# Patient Record
Sex: Female | Born: 1935 | Smoking: Former smoker
Health system: Southern US, Community
[De-identification: ages and names within clinical notes are randomized; demographics above are authoritative.]

## PROBLEM LIST (undated history)

## (undated) DIAGNOSIS — I341 Nonrheumatic mitral (valve) prolapse: Secondary | ICD-10-CM

## (undated) DIAGNOSIS — I10 Essential (primary) hypertension: Secondary | ICD-10-CM

## (undated) DIAGNOSIS — E785 Hyperlipidemia, unspecified: Secondary | ICD-10-CM

## (undated) DIAGNOSIS — J449 Chronic obstructive pulmonary disease, unspecified: Secondary | ICD-10-CM

## (undated) DIAGNOSIS — N183 Chronic kidney disease, stage 3 unspecified: Secondary | ICD-10-CM

## (undated) DIAGNOSIS — E039 Hypothyroidism, unspecified: Secondary | ICD-10-CM

## (undated) HISTORY — DX: Hyperlipidemia, unspecified: E78.5

## (undated) HISTORY — PX: BRAIN TUMOR EXCISION: SHX577

## (undated) HISTORY — PX: ABDOMINAL HYSTERECTOMY: SHX81

## (undated) HISTORY — DX: Chronic obstructive pulmonary disease, unspecified: J44.9

## (undated) HISTORY — PX: BACK SURGERY: SHX140

## (undated) HISTORY — PX: APPENDECTOMY: SHX54

## (undated) HISTORY — DX: Hypothyroidism, unspecified: E03.9

## (undated) HISTORY — DX: Nonrheumatic mitral (valve) prolapse: I34.1

## (undated) HISTORY — DX: Chronic kidney disease, stage 3 unspecified: N18.30

## (undated) HISTORY — PX: CHOLECYSTECTOMY: SHX55

## (undated) HISTORY — DX: Essential (primary) hypertension: I10

---

## 2018-05-05 ENCOUNTER — Encounter: Payer: Self-pay | Admitting: Cardiology

## 2019-04-13 ENCOUNTER — Encounter: Payer: Self-pay | Admitting: Family Medicine

## 2019-04-13 ENCOUNTER — Other Ambulatory Visit: Payer: Self-pay

## 2019-04-13 ENCOUNTER — Ambulatory Visit (INDEPENDENT_AMBULATORY_CARE_PROVIDER_SITE_OTHER): Payer: Medicare Other | Admitting: Family Medicine

## 2019-04-13 VITALS — BP 116/69 | HR 90

## 2019-04-13 DIAGNOSIS — J449 Chronic obstructive pulmonary disease, unspecified: Secondary | ICD-10-CM | POA: Diagnosis not present

## 2019-04-13 DIAGNOSIS — M810 Age-related osteoporosis without current pathological fracture: Secondary | ICD-10-CM

## 2019-04-13 DIAGNOSIS — I890 Lymphedema, not elsewhere classified: Secondary | ICD-10-CM

## 2019-04-13 DIAGNOSIS — K529 Noninfective gastroenteritis and colitis, unspecified: Secondary | ICD-10-CM

## 2019-04-13 DIAGNOSIS — M479 Spondylosis, unspecified: Secondary | ICD-10-CM

## 2019-04-13 DIAGNOSIS — M545 Low back pain, unspecified: Secondary | ICD-10-CM

## 2019-04-13 DIAGNOSIS — G8929 Other chronic pain: Secondary | ICD-10-CM

## 2019-04-13 DIAGNOSIS — M48061 Spinal stenosis, lumbar region without neurogenic claudication: Secondary | ICD-10-CM

## 2019-04-13 DIAGNOSIS — J309 Allergic rhinitis, unspecified: Secondary | ICD-10-CM | POA: Insufficient documentation

## 2019-04-13 DIAGNOSIS — E039 Hypothyroidism, unspecified: Secondary | ICD-10-CM | POA: Diagnosis not present

## 2019-04-13 DIAGNOSIS — I259 Chronic ischemic heart disease, unspecified: Secondary | ICD-10-CM | POA: Insufficient documentation

## 2019-04-13 DIAGNOSIS — G629 Polyneuropathy, unspecified: Secondary | ICD-10-CM

## 2019-04-13 DIAGNOSIS — G2581 Restless legs syndrome: Secondary | ICD-10-CM

## 2019-04-13 MED ORDER — PRAVASTATIN SODIUM 80 MG PO TABS: 80.0000 mg | ORAL_TABLET | Freq: Every day | ORAL | 3 refills | Status: DC

## 2019-04-13 MED ORDER — TEMAZEPAM 30 MG PO CAPS: 30.0000 mg | ORAL_CAPSULE | Freq: Every evening | ORAL | 0 refills | Status: DC | PRN

## 2019-04-13 NOTE — Progress Notes (Signed)
Office Visit Note   Patient: Kiara Jones           Date of Birth: November 05, 1935           MRN: [id number] Visit Date: 04/13/2019 Requested by: No referring provider defined for this encounter. PCP: No primary care provider on file.  Subjective: Chief Complaint  Patient presents with  . establish primary care    HPI: This is a very nice 83 year old woman who presents to establish care.  She was born in this area but moved away when she was 83 years old and lived in [place] for a while and then [place].  A few months ago her sister died and so she decided to move back here with her sister's husband.  She has a history of chronic back pain.  She has had 17 surgeries and multiple other interventional procedures which have not helped.  For many years now she has been on oxycodone 10 mg every 4 hours as needed and extended release OxyContin as well.  This regimen apparently has not changed.  She is still getting refills from her pain specialist while she gets settled, but she would like referral to pain specialist who is willing to manage her medication without doing more interventional procedures.  She has a history of ischemic heart disease which is stable.  Blood pressure is well controlled with losartan and amlodipine.  She sees a cardiologist yearly for EKG and stress testing.  She is asymptomatic today.  She has a history of COPD due to smoking.  She no longer smokes cigarettes.  Her lungs have been well controlled with Flovent.  She would like to establish with a pulmonologist for periodic monitoring.  She has hypothyroidism well controlled with medication.  She will be due for labs in June.  She has osteoporosis and a history of vitamin D deficiency.  She is presently on vitamin D3 at 2000 IU daily.  She has restless legs syndrome and uses Sinemet, and takes occasional Restoril at night to help her sleep.  She has a history of colitis which is currently stable.  She has bilateral  chronic lymphedema in her legs.  She has peripheral neuropathy in her hands and feet.                ROS: No fevers or chills.  No palpitations or chest pain.  No abdominal pain but she does have frequent constipation.  All other systems were reviewed and are negative.  Objective: Vital Signs: BP 116/69   Pulse 90   Physical Exam:  General:  Alert and oriented, in no acute distress. Pulm:  Breathing unlabored. Psy:  Normal mood, congruent affect. Skin: No visible rash. Neck: No thyromegaly or nodules.  2+ carotid pulses with no bruits. Lungs: Good air movement throughout with no wheezing today. Heart: Regular rate and rhythm with no murmurs, rubs, or gallops. Extremities: 2+ edema in both lower extremities to the knees. Low back: She has extensive well-healed surgical scars.  She has a couple spinous processes that protrude further than the others.  To the left of midline in the upper lumbar area there is a soft tissue firm movable nodule measuring a little more than a centimeter in length.   Imaging: None today  Assessment & Plan: 1.  Chronic back pain status post 17 surgeries and multiple interventional procedures. -We will refer her to pain management for medication management only.  2.  Chronic ischemic heart disease, currently asymptomatic. -We will refer  her to establish with cardiology.  3.  COPD, well controlled. -Pulmonary consult.  4.  Hypothyroidism, clinically euthyroid -Refills when needed.  Labs in 6 months.  5.  Colitis, currently stable. -We discussed potential dietary changes if symptoms flareup again.  6.  Osteoporosis -Vitamin D level in 6 months.  We will request old records and check a new bone density test when indicated.     Procedures: No procedures performed  No notes on file     PMFS History: Patient Active Problem List   Diagnosis Date Noted  . Allergic rhinitis 04/13/2019  . COPD (chronic obstructive pulmonary disease) ([place])  04/13/2019  . Hypothyroid 04/13/2019  . Osteoporosis 04/13/2019  . RLS (restless legs syndrome) 04/13/2019  . Chronic ischemic heart disease 04/13/2019  . Spondylosis 04/13/2019  . Colitis 04/13/2019  . Spinal stenosis of lumbar region 04/13/2019  . Peripheral neuropathy 04/13/2019  . Lymphedema of leg 04/13/2019   History reviewed. No pertinent past medical history.  History reviewed. No pertinent family history.  History reviewed. No pertinent surgical history. Social History   Occupational History  . Not on file  Tobacco Use  . Smoking status: Not on file  Substance and Sexual Activity  . Alcohol use: Not on file  . Drug use: Not on file  . Sexual activity: Not on file

## 2019-04-14 ENCOUNTER — Telehealth: Payer: Self-pay | Admitting: *Deleted

## 2019-04-14 NOTE — Telephone Encounter (Signed)
A message was left, re: her new patient appointment. 

## 2019-05-06 ENCOUNTER — Telehealth: Payer: Self-pay | Admitting: Family Medicine

## 2019-05-06 NOTE — Telephone Encounter (Signed)
Patient's brother called. Says they have not heard anything from the pain doctor. Would like to know if the referral was done. His call back number is [phone]

## 2019-05-07 NOTE — Telephone Encounter (Signed)
I advised Mr. Kiara Jones the turn-around time with pain management referrals/medical record reviews is typically 4-6 weeks. The pain management doctor's office will call the patient directly with the appointment date/time.

## 2019-05-11 NOTE — Progress Notes (Deleted)
Referring-Michael Hilts MD Reason for referral-coronary artery disease  HPI: 84 year old female for evaluation of coronary artery disease at request of Lavada Mesi MD.  Patient previously resided in [place] and [place].  Current Outpatient Medications  Medication Sig Dispense Refill  . amLODipine (NORVASC) 2.5 MG tablet Take 2.5 mg by mouth 2 (two) times daily.    . busPIRone (BUSPAR) 15 MG tablet Take 15 mg by mouth 2 (two) times daily.    . carbidopa-levodopa (SINEMET IR) 25-100 MG tablet Take 2 tablets by mouth daily.    Marland Kitchen FLOVENT DISKUS 250 MCG/BLIST AEPB Take 1 puff by mouth 2 (two) times daily.    . hydrochlorothiazide (MICROZIDE) 12.5 MG capsule Take 12.5 mg by mouth daily.    Marland Kitchen levothyroxine (SYNTHROID) 75 MCG tablet Take 75 mcg by mouth daily.    Marland Kitchen losartan (COZAAR) 100 MG tablet Take 100 mg by mouth daily.    Marland Kitchen morphine (MS CONTIN) 30 MG 12 hr tablet Take 30 mg by mouth 2 (two) times daily.    Marland Kitchen oxyCODONE-acetaminophen (PERCOCET) 10-325 MG tablet Take 1 tablet by mouth every 4 (four) hours as needed.    . pravastatin (PRAVACHOL) 80 MG tablet Take 1 tablet (80 mg total) by mouth daily. 90 tablet 3  . temazepam (RESTORIL) 30 MG capsule Take 1 capsule (30 mg total) by mouth at bedtime as needed for sleep. 90 capsule 0   No current facility-administered medications for this visit.    Allergies  Allergen Reactions  . Gabapentin Shortness Of Breath and Other (See Comments)    Fatigue, blurred vision  . Lyrica [Pregabalin] Shortness Of Breath and Other (See Comments)    Blurred vision, fatigue  . Alprazolam Other (See Comments)    Sick to stomach, tremors inside & out, loss of appetite  . Codeine Other (See Comments)    Heartburn, ulcer upset indigestion  . Nsaids Other (See Comments)    "upsets my ulcer"   . Penicillins Other (See Comments)    Cold sweat, almost passed out  . Sertraline Other (See Comments)    Sick to stomach, tremors inside & out, loss of appetite    . Tape   . Silicone Rash    Silicone-based pain patches - caused a rash  . Sulfa Antibiotics Rash    No past medical history on file.  No past surgical history on file.  Social History   Socioeconomic History  . Marital status: Widowed    Spouse name: Not on file  . Number of children: Not on file  . Years of education: Not on file  . Highest education level: Not on file  Occupational History  . Not on file  Tobacco Use  . Smoking status: Not on file  Substance and Sexual Activity  . Alcohol use: Not on file  . Drug use: Not on file  . Sexual activity: Not on file  Other Topics Concern  . Not on file  Social History Narrative  . Not on file   Social Determinants of Health   Financial Resource Strain:   . Difficulty of Paying Living Expenses: Not on file  Food Insecurity:   . Worried About Programme researcher, broadcasting/film/video in the Last Year: Not on file  . Ran Out of Food in the Last Year: Not on file  Transportation Needs:   . Lack of Transportation (Medical): Not on file  . Lack of Transportation (Non-Medical): Not on file  Physical Activity:   . Days of  Exercise per Week: Not on file  . Minutes of Exercise per Session: Not on file  Stress:   . Feeling of Stress : Not on file  Social Connections:   . Frequency of Communication with Friends and Family: Not on file  . Frequency of Social Gatherings with Friends and Family: Not on file  . Attends Religious Services: Not on file  . Active Member of Clubs or Organizations: Not on file  . Attends Archivist Meetings: Not on file  . Marital Status: Not on file  Intimate Partner Violence:   . Fear of Current or Ex-Partner: Not on file  . Emotionally Abused: Not on file  . Physically Abused: Not on file  . Sexually Abused: Not on file    No family history on file.  ROS: no fevers or chills, productive cough, hemoptysis, dysphasia, odynophagia, melena, hematochezia, dysuria, hematuria, rash, seizure activity,  orthopnea, PND, pedal edema, claudication. Remaining systems are negative.  Physical Exam:   There were no vitals taken for this visit.  General:  Well developed/well nourished in NAD Skin warm/dry Patient not depressed No peripheral clubbing Back-normal HEENT-normal/normal eyelids Neck supple/normal carotid upstroke bilaterally; no bruits; no JVD; no thyromegaly chest - CTA/ normal expansion CV - RRR/normal S1 and S2; no murmurs, rubs or gallops;  PMI nondisplaced Abdomen -NT/ND, no HSM, no mass, + bowel sounds, no bruit 2+ femoral pulses, no bruits Ext-no edema, chords, 2+ DP Neuro-grossly nonfocal  ECG - personally reviewed  A/P  1 coronary artery disease-  Kirk Ruths, MD

## 2019-05-13 ENCOUNTER — Ambulatory Visit: Payer: Medicare Other | Admitting: Cardiology

## 2019-06-02 NOTE — Progress Notes (Deleted)
Referring-Michael Hilts MD Reason for referral-coronary artery disease  HPI: 84 year old female for evaluation of coronary artery disease at request of Eunice Blase MD.  Current Outpatient Medications  Medication Sig Dispense Refill  . amLODipine (NORVASC) 2.5 MG tablet Take 2.5 mg by mouth 2 (two) times daily.    . busPIRone (BUSPAR) 15 MG tablet Take 15 mg by mouth 2 (two) times daily.    . carbidopa-levodopa (SINEMET IR) 25-100 MG tablet Take 2 tablets by mouth daily.    Marland Kitchen FLOVENT DISKUS 250 MCG/BLIST AEPB Take 1 puff by mouth 2 (two) times daily.    . hydrochlorothiazide (MICROZIDE) 12.5 MG capsule Take 12.5 mg by mouth daily.    Marland Kitchen levothyroxine (SYNTHROID) 75 MCG tablet Take 75 mcg by mouth daily.    Marland Kitchen losartan (COZAAR) 100 MG tablet Take 100 mg by mouth daily.    Marland Kitchen morphine (MS CONTIN) 30 MG 12 hr tablet Take 30 mg by mouth 2 (two) times daily.    Marland Kitchen oxyCODONE-acetaminophen (PERCOCET) 10-325 MG tablet Take 1 tablet by mouth every 4 (four) hours as needed.    . pravastatin (PRAVACHOL) 80 MG tablet Take 1 tablet (80 mg total) by mouth daily. 90 tablet 3  . temazepam (RESTORIL) 30 MG capsule Take 1 capsule (30 mg total) by mouth at bedtime as needed for sleep. 90 capsule 0   No current facility-administered medications for this visit.    Allergies  Allergen Reactions  . Gabapentin Shortness Of Breath and Other (See Comments)    Fatigue, blurred vision  . Lyrica [Pregabalin] Shortness Of Breath and Other (See Comments)    Blurred vision, fatigue  . Alprazolam Other (See Comments)    Sick to stomach, tremors inside & out, loss of appetite  . Codeine Other (See Comments)    Heartburn, ulcer upset indigestion  . Nsaids Other (See Comments)    "upsets my ulcer"   . Penicillins Other (See Comments)    Cold sweat, almost passed out  . Sertraline Other (See Comments)    Sick to stomach, tremors inside & out, loss of appetite  . Tape   . Silicone Rash    Silicone-based pain  patches - caused a rash  . Sulfa Antibiotics Rash    No past medical history on file.  No past surgical history on file.  Social History   Socioeconomic History  . Marital status: Widowed    Spouse name: Not on file  . Number of children: Not on file  . Years of education: Not on file  . Highest education level: Not on file  Occupational History  . Not on file  Tobacco Use  . Smoking status: Not on file  Substance and Sexual Activity  . Alcohol use: Not on file  . Drug use: Not on file  . Sexual activity: Not on file  Other Topics Concern  . Not on file  Social History Narrative  . Not on file   Social Determinants of Health   Financial Resource Strain:   . Difficulty of Paying Living Expenses: Not on file  Food Insecurity:   . Worried About Charity fundraiser in the Last Year: Not on file  . Ran Out of Food in the Last Year: Not on file  Transportation Needs:   . Lack of Transportation (Medical): Not on file  . Lack of Transportation (Non-Medical): Not on file  Physical Activity:   . Days of Exercise per Week: Not on file  . Minutes  of Exercise per Session: Not on file  Stress:   . Feeling of Stress : Not on file  Social Connections:   . Frequency of Communication with Friends and Family: Not on file  . Frequency of Social Gatherings with Friends and Family: Not on file  . Attends Religious Services: Not on file  . Active Member of Clubs or Organizations: Not on file  . Attends Banker Meetings: Not on file  . Marital Status: Not on file  Intimate Partner Violence:   . Fear of Current or Ex-Partner: Not on file  . Emotionally Abused: Not on file  . Physically Abused: Not on file  . Sexually Abused: Not on file    No family history on file.  ROS: no fevers or chills, productive cough, hemoptysis, dysphasia, odynophagia, melena, hematochezia, dysuria, hematuria, rash, seizure activity, orthopnea, PND, pedal edema, claudication. Remaining  systems are negative.  Physical Exam:   There were no vitals taken for this visit.  General:  Well developed/well nourished in NAD Skin warm/dry Patient not depressed No peripheral clubbing Back-normal HEENT-normal/normal eyelids Neck supple/normal carotid upstroke bilaterally; no bruits; no JVD; no thyromegaly chest - CTA/ normal expansion CV - RRR/normal S1 and S2; no murmurs, rubs or gallops;  PMI nondisplaced Abdomen -NT/ND, no HSM, no mass, + bowel sounds, no bruit 2+ femoral pulses, no bruits Ext-no edema, chords, 2+ DP Neuro-grossly nonfocal  ECG - personally reviewed  A/P  1 coronary artery disease-  2 hypertension-blood pressure controlled.  Continue present medical regimen.  3 hyperlipidemia-  Olga Millers, MD

## 2019-06-03 ENCOUNTER — Telehealth: Payer: Self-pay | Admitting: Orthopaedic Surgery

## 2019-06-03 NOTE — Telephone Encounter (Signed)
Mr. Roxan Hockey called on behalf of patient's referral for pain management. Mr. Roxan Hockey is asking for updates about referral being sent to pain management. Please give Mr. Roxan Hockey a call back. Mr. Roxan Hockey phone number is [phone].

## 2019-06-03 NOTE — Telephone Encounter (Signed)
Hilts patient.  

## 2019-06-04 NOTE — Telephone Encounter (Signed)
I called Mr. Kiara Jones back and gave him the number to [facility] [phone]) so he may call them directly to check on this (it has been 8 weeks since we referred her).

## 2019-06-11 ENCOUNTER — Ambulatory Visit: Payer: Medicare Other | Admitting: Cardiology

## 2019-06-17 ENCOUNTER — Telehealth: Payer: Self-pay | Admitting: Family Medicine

## 2019-06-17 NOTE — Telephone Encounter (Signed)
Referral was faxed on 04/13/19, has not heard anything from Tripp with [facility], called Ryan to check status lvm to return call

## 2019-06-17 NOTE — Telephone Encounter (Signed)
Can you check into this please? I don't know what else to tell him about this.

## 2019-06-17 NOTE — Telephone Encounter (Signed)
Patient's brother [name] called requesting a call back from Dr. Prince Rome or nurse. Mr. [name] stated it has been 3 weeks and still has not been contacted by pain management. No one has not reached ou about this matter. Mr. [name] stated Dr. Prince Rome refer patient weeks ago. Mr. [name] phone number is [phone].

## 2019-06-18 ENCOUNTER — Telehealth: Payer: Self-pay | Admitting: Family Medicine

## 2019-06-18 NOTE — Telephone Encounter (Signed)
Can we check on this one, it looks like the referral was put in, in December

## 2019-06-18 NOTE — Telephone Encounter (Signed)
Kiara Jones called in stating he still hasn't received a call from the pain management facility and would like to know if they can be referred else where? Kiara Jones also stated that he would really like a call back from Dr. Prince Rome to discuss this.   [phone]

## 2019-06-18 NOTE — Telephone Encounter (Signed)
Left message with [facility], they close early on Fridays.  Called and spoke with [name]. Explained that we are over the usually 4-6 week review process pain management usually requires. He advised he had also called [facility] with no return calls. I advised to him if I do not here anything on Monday on status and still cannot contact anyone. Will discuss with Dr. Prince Rome on referral else where. [name] was very appreciative of call back and plan.

## 2019-06-21 ENCOUNTER — Other Ambulatory Visit: Payer: Self-pay | Admitting: Family Medicine

## 2019-06-21 DIAGNOSIS — G8929 Other chronic pain: Secondary | ICD-10-CM

## 2019-06-21 DIAGNOSIS — M545 Low back pain, unspecified: Secondary | ICD-10-CM

## 2019-06-21 DIAGNOSIS — M479 Spondylosis, unspecified: Secondary | ICD-10-CM

## 2019-06-22 ENCOUNTER — Telehealth: Payer: Self-pay | Admitting: Radiology

## 2019-06-22 NOTE — Telephone Encounter (Signed)
noted 

## 2019-06-22 NOTE — Telephone Encounter (Signed)
I sw Todd and he is understanding about the issue with the fax with [facility] and that I did speak with Alycia Rossetti at pain mgmt and Alycia Rossetti will try to get her in no later than the end of March but will put her on the cancellatin list as well. I also informed [name] that I could send her to [facility] and he said pt would rather stick with [facility]. Ryan with Pain management is needing notes and cover sheet sent to him so he can get her scheduled.

## 2019-06-22 NOTE — Telephone Encounter (Signed)
Left message with [name] advising that our Kiara Jones had already been in contact with [facility] prior to speaking with me. Advised that the facility had issues with their fax for several weeks that was unfortunately delaying referral. Advised in message that Kiara Jones is expecting to speak with someone from [facility] today and that Dr. Prince Rome has already put in new referral if it is going to take too long to get patient scheduled.

## 2019-07-01 ENCOUNTER — Telehealth: Payer: Self-pay | Admitting: Family Medicine

## 2019-07-01 NOTE — Telephone Encounter (Signed)
Pt brother in law [name] called in requesting a call from terri in regards to the referral dr.hilts put in to the pain clinic said he has tried contacting them plenty of times but no success.   [phone]

## 2019-07-01 NOTE — Telephone Encounter (Signed)
I tried calling twice - both times the phone stopped ringing as if someone picks up, and then I get cut off. Will try again another time.

## 2019-07-01 NOTE — Telephone Encounter (Signed)
I spoke with [name]: Dr. Vear Clock' office has not called them back yet. He would like to proceed with a referral to [facility], instead. I have advised [name], our referral coordinator, of this. Her pain management doctor from [facility]  is still treating her until she can get in with someone here in [place].

## 2019-07-08 ENCOUNTER — Telehealth: Payer: Self-pay | Admitting: *Deleted

## 2019-07-08 NOTE — Telephone Encounter (Signed)
I have contacted Marchelle Folks with [facility] to return my call for status of appt.

## 2019-07-08 NOTE — Telephone Encounter (Signed)
-----   Message from Rip Harbour, [place] sent at 07/01/2019  1:26 PM EST ----- Regarding: pain management referral Per the patient's brother-in-law Jerlyn Ly, please refer the patient to [facility] for pain management. They are tired of waiting for a phone call from Dr. Vear Clock' office.

## 2019-07-12 ENCOUNTER — Telehealth: Payer: Self-pay | Admitting: Family Medicine

## 2019-07-12 NOTE — Telephone Encounter (Signed)
It looks like you have been dealing with this today. Have you spoken with [name]?

## 2019-07-12 NOTE — Telephone Encounter (Signed)
Kiara Jones pt brother had contacted me again stating still has not heard anyting from pain mgmt, I contact Marchelle Folks and left vm for her to return my call on status of referral. I called again today.

## 2019-07-12 NOTE — Telephone Encounter (Signed)
Patient's brother in law [name] called concerning the pain clinic patient was referred to. [name] said he has not heard anything in 2 months. The number to contact [name] is [phone]

## 2019-07-15 ENCOUNTER — Telehealth: Payer: Self-pay | Admitting: Family Medicine

## 2019-07-15 NOTE — Telephone Encounter (Signed)
Patient's brother called.   He is requesting a call back from Dr.Hilts' medical assistant and did not disclose why.   Call back: [phone]

## 2019-07-16 NOTE — Telephone Encounter (Signed)
Sw Todd today to see if he has heard from [place] and she stated he has not but he has called Marchelle Folks with [facility] and left message to return his call  I called [facility] and left message with Marchelle Folks vm asking for a call back to see about status of referral.

## 2019-07-16 NOTE — Telephone Encounter (Signed)
Patient aware that we actually sent her referral to [facility]. I gave him the number and he will check with them

## 2019-07-20 NOTE — Telephone Encounter (Signed)
Kiara Jones with [facility] called me back left vm to return her call at [phone] ext [phone], I called got a receptionist asked to sendn me to this ext and she stated she can take message and have her to call me back. Pending call back.

## 2019-07-20 NOTE — Telephone Encounter (Signed)
I called [name] and asked if he had heard from Kennedy with pain clinic and she stated no he hasn't, so I called over to [facility] pain left vm again with Marchelle Folks to return my call, then, I called the main number and sw with receptionist there to see if can speak with Marchelle Folks or asked if some reason she isn't returning calls, she contacted all facilities to see if can get her and no success, she is going to try to contact Marchelle Folks again for me and then have her to call me.

## 2019-07-21 NOTE — Telephone Encounter (Signed)
I called and left vm with Marchelle Folks to return my call again.

## 2019-07-22 NOTE — Telephone Encounter (Signed)
Per [name] and [place] with pain clinic pt is scheduled on 07/27/19 at 345pm.

## 2019-07-29 ENCOUNTER — Telehealth: Payer: Self-pay | Admitting: Family Medicine

## 2019-07-29 NOTE — Telephone Encounter (Signed)
Please advise 

## 2019-07-29 NOTE — Telephone Encounter (Signed)
Patient called needing Rx refilled Temazepam 30 mg, Pravastatin 80 mg and Amlodipine 2.5 mg. The number to contact patient is [phone]

## 2019-07-30 MED ORDER — AMLODIPINE BESYLATE 2.5 MG PO TABS: 2.5000 mg | ORAL_TABLET | Freq: Two times a day (BID) | ORAL | 2 refills | Status: DC

## 2019-07-30 MED ORDER — PRAVASTATIN SODIUM 80 MG PO TABS: 80.0000 mg | ORAL_TABLET | Freq: Every day | ORAL | 3 refills | Status: DC

## 2019-07-30 NOTE — Telephone Encounter (Signed)
See notes below. Can you submit all but temazepam

## 2019-07-30 NOTE — Telephone Encounter (Signed)
Tried calling to advise done. No answer. LMVM advising.  

## 2019-07-30 NOTE — Telephone Encounter (Signed)
Ok to fill all but temazepam.

## 2019-08-04 ENCOUNTER — Other Ambulatory Visit: Payer: Self-pay | Admitting: Family Medicine

## 2019-08-04 ENCOUNTER — Telehealth: Payer: Self-pay | Admitting: Family Medicine

## 2019-08-04 NOTE — Telephone Encounter (Signed)
Patient brother in law [name]) called asking why the Rx for Temazepam is not being sent to the pharmacy? [name] said there has been several calls concerning trying to get this Rx filled. Patient was also talking in the background advised she have not received any messages concerning the Rx. (Patient has two tabs left.)   [facility] uses the [facility] [address].   The number to contact [name] is [phone]

## 2019-08-04 NOTE — Telephone Encounter (Signed)
Please advise on the specific reason this one is not being refilled.

## 2019-08-04 NOTE — Telephone Encounter (Signed)
I called and spoke with [name] about the alternative. He asked that I call back and leave all the names and instructions on his voice mail. I did this and asked him to call back if he has any questions.

## 2019-08-04 NOTE — Progress Notes (Signed)
Referring-Michael Hilts MD Reason for referral-coronary artery disease and hypertension  HPI: 84 year old female for evaluation of coronary artery disease and hypertension at request of Lavada Mesi MD. Patient previously resided in [place].  I do not have all records available.  She has had previous catheterization but has never received stents.  She also has a history of mitral valve prolapse.  She recently moved to this area and presents to establish.  She has some dyspnea on exertion that she attributes to COPD.  She denies orthopnea or PND.  She has chronic bilateral lower extremity edema.  There is no chest pain or syncope.  Current Outpatient Medications  Medication Sig Dispense Refill  . amLODipine (NORVASC) 2.5 MG tablet Take 1 tablet (2.5 mg total) by mouth 2 (two) times daily. 180 tablet 2  . busPIRone (BUSPAR) 15 MG tablet Take 15 mg by mouth 2 (two) times daily.    . carbidopa-levodopa (SINEMET IR) 25-100 MG tablet Take 2 tablets by mouth daily.    Marland Kitchen FLOVENT DISKUS 250 MCG/BLIST AEPB Take 1 puff by mouth 2 (two) times daily.    . hydrochlorothiazide (MICROZIDE) 12.5 MG capsule Take 12.5 mg by mouth daily.    Marland Kitchen levothyroxine (SYNTHROID) 75 MCG tablet Take 75 mcg by mouth daily.    Marland Kitchen losartan (COZAAR) 100 MG tablet Take 100 mg by mouth daily.    . megestrol (MEGACE) 40 MG/ML suspension Take 800 mg by mouth daily.    Marland Kitchen morphine (MS CONTIN) 30 MG 12 hr tablet Take 30 mg by mouth 2 (two) times daily.    Marland Kitchen oxyCODONE-acetaminophen (PERCOCET) 10-325 MG tablet Take 1 tablet by mouth every 4 (four) hours as needed.    . pravastatin (PRAVACHOL) 80 MG tablet Take 1 tablet (80 mg total) by mouth daily. 90 tablet 3  . temazepam (RESTORIL) 30 MG capsule Take 1 capsule (30 mg total) by mouth at bedtime as needed for sleep. 90 capsule 0   No current facility-administered medications for this visit.    Allergies  Allergen Reactions  . Gabapentin Shortness Of Breath and Other (See Comments)     Fatigue, blurred vision  . Lyrica [Pregabalin] Shortness Of Breath and Other (See Comments)    Blurred vision, fatigue  . Alprazolam Other (See Comments)    Sick to stomach, tremors inside & out, loss of appetite  . Codeine Other (See Comments)    Heartburn, ulcer upset indigestion  . Nsaids Other (See Comments)    "upsets my ulcer"   . Penicillins Other (See Comments)    Cold sweat, almost passed out  . Sertraline Other (See Comments)    Sick to stomach, tremors inside & out, loss of appetite  . Tape   . Silicone Rash    Silicone-based pain patches - caused a rash  . Sulfa Antibiotics Rash     Past Medical History:  Diagnosis Date  . Chronic kidney disease (CKD), active medical management without dialysis, stage 3 (moderate)   . COPD (chronic obstructive pulmonary disease) (HCC)   . Hyperlipidemia   . Hypertension   . Hypothyroid     Past Surgical History:  Procedure Laterality Date  . ABDOMINAL HYSTERECTOMY    . APPENDECTOMY    . BACK SURGERY    . BRAIN TUMOR EXCISION    . CHOLECYSTECTOMY      Social History   Socioeconomic History  . Marital status: Widowed    Spouse name: Not on file  . Number of children: Not  on file  . Years of education: Not on file  . Highest education level: Not on file  Occupational History  . Not on file  Tobacco Use  . Smoking status: Former Smoker    Types: Cigarettes, E-cigarettes  . Smokeless tobacco: Former Network engineer and Sexual Activity  . Alcohol use: Never  . Drug use: Never  . Sexual activity: Not on file  Other Topics Concern  . Not on file  Social History Narrative  . Not on file   Social Determinants of Health   Financial Resource Strain:   . Difficulty of Paying Living Expenses:   Food Insecurity:   . Worried About Charity fundraiser in the Last Year:   . Arboriculturist in the Last Year:   Transportation Needs:   . Film/video editor (Medical):   Marland Kitchen Lack of Transportation (Non-Medical):     Physical Activity:   . Days of Exercise per Week:   . Minutes of Exercise per Session:   Stress:   . Feeling of Stress :   Social Connections:   . Frequency of Communication with Friends and Family:   . Frequency of Social Gatherings with Friends and Family:   . Attends Religious Services:   . Active Member of Clubs or Organizations:   . Attends Archivist Meetings:   Marland Kitchen Marital Status:   Intimate Partner Violence:   . Fear of Current or Ex-Partner:   . Emotionally Abused:   Marland Kitchen Physically Abused:   . Sexually Abused:     Family History  Problem Relation Age of Onset  . Breast cancer Mother   . CAD Father     ROS: Chronic back pain but no fevers or chills, productive cough, hemoptysis, dysphasia, odynophagia, melena, hematochezia, dysuria, hematuria, rash, seizure activity, orthopnea, PND, claudication. Remaining systems are negative.  Physical Exam:   Blood pressure (!) 160/80, pulse 95, temperature 98 F (36.7 C), resp. rate 20, height 4\' 10"  (1.473 m), weight 110 lb 12.8 oz (50.3 kg), SpO2 96 %.  General:  Well developed/well nourished in NAD Skin warm/dry Patient not depressed No peripheral clubbing Back-normal HEENT-normal/normal eyelids Neck supple/normal carotid upstroke bilaterally; no bruits; no JVD; no thyromegaly chest -diminished breath sounds throughout CV - RRR/normal S1 and S2; no murmurs, rubs or gallops;  PMI nondisplaced Abdomen -NT/ND, no HSM, no mass, + bowel sounds, no bruit 2+ femoral pulses, no bruits Ext-trac edema, chords, 2+ DP Neuro-grossly nonfocal  ECG -sinus rhythm at a rate of 89, cannot rule out prior septal infarct, left atrial enlargement.  Personally reviewed  A/P  1 coronary artery disease-we will obtain all records from [place].  She has never received a stent and is unsure if she has documented coronary disease.  We will continue statin for now.  We will add aspirin later if needed.  2 hypertension-blood pressure  elevated but she states typically controlled.  Continue present medications and follow.  3 hyperlipidemia-continue statin.  4 MVP-we will obtain outside records.  Kirk Ruths, MD

## 2019-08-04 NOTE — Telephone Encounter (Signed)
I spoke with Mr. Kiara Jones and explained the Temazepam is dangerous to take with Oxycodone - the pain clinic would need to prescribe it.   He asks if there is some alternative to the Temazepam that she could try instead.   Please advise.

## 2019-08-09 ENCOUNTER — Other Ambulatory Visit: Payer: Self-pay

## 2019-08-09 ENCOUNTER — Ambulatory Visit (INDEPENDENT_AMBULATORY_CARE_PROVIDER_SITE_OTHER): Payer: Medicare Other | Admitting: Cardiology

## 2019-08-09 ENCOUNTER — Encounter: Payer: Self-pay | Admitting: Cardiology

## 2019-08-09 VITALS — BP 160/80 | HR 95 | Temp 98.0°F | Resp 20 | Ht <= 58 in | Wt 110.8 lb

## 2019-08-09 DIAGNOSIS — I10 Essential (primary) hypertension: Secondary | ICD-10-CM | POA: Diagnosis not present

## 2019-08-09 DIAGNOSIS — I2510 Atherosclerotic heart disease of native coronary artery without angina pectoris: Secondary | ICD-10-CM

## 2019-08-09 DIAGNOSIS — E7800 Pure hypercholesterolemia, unspecified: Secondary | ICD-10-CM

## 2019-08-09 NOTE — Patient Instructions (Signed)

## 2019-08-13 ENCOUNTER — Telehealth: Payer: Self-pay | Admitting: Family Medicine

## 2019-08-13 NOTE — Telephone Encounter (Signed)
I called and spoke with the patient.  Dr. Prince Rome said she would need to contact her pain management doctor about getting an Rx for temazepam. She did see the pain management doctor (at [facility]) on 07/27/19, but she did not ask about prescribing temazepam - her PCP in [facility] was the one that prescribed it before. The patient will reach out to this doctor (at [place]) soon.

## 2019-08-13 NOTE — Telephone Encounter (Signed)
Patient called.   The vitamin regime she was switched to in place of her sleeping medicine is not doing the job for her. She would like a call back to discuss her next course of action.   Call back: [phone]

## 2019-08-23 ENCOUNTER — Other Ambulatory Visit: Payer: Self-pay | Admitting: Family Medicine

## 2019-09-15 ENCOUNTER — Other Ambulatory Visit: Payer: Self-pay

## 2019-09-15 ENCOUNTER — Encounter: Payer: Self-pay | Admitting: Family Medicine

## 2019-09-15 ENCOUNTER — Ambulatory Visit (INDEPENDENT_AMBULATORY_CARE_PROVIDER_SITE_OTHER): Payer: Medicare Other | Admitting: Family Medicine

## 2019-09-15 DIAGNOSIS — R300 Dysuria: Secondary | ICD-10-CM | POA: Diagnosis not present

## 2019-09-15 MED ORDER — NITROFURANTOIN MONOHYD MACRO 100 MG PO CAPS
100.0000 mg | ORAL_CAPSULE | Freq: Two times a day (BID) | ORAL | 0 refills | Status: DC
Start: 2019-09-15 — End: 2020-04-12

## 2019-09-15 NOTE — Progress Notes (Signed)
   Office Visit Note   Patient: Kiara Jones           Date of Birth: 1935-10-13           MRN: [id number] Visit Date: 09/15/2019 Requested by: Lavada Mesi, MD [address] [place],  [place] [address] PCP: Lavada Mesi, MD  Subjective: Chief Complaint  Patient presents with  . Lower Back - Pain    Pain around the sacral area x 2 weeks.  . dark, smelly urine w/urgency x many weeks    HPI: She is here with urinary frequency, hesitancy and urgency as well as foul urinary odor for the past 2 weeks.  She has not had a bladder infection for many years.  She is not sure what caused this.  No fevers or chills, no flank pain.  She has tried drinking cranberry juice and extra water with no improvement.               ROS:   All other systems were reviewed and are negative.  Objective: Vital Signs: There were no vitals taken for this visit.  Physical Exam:  General:  Alert and oriented, in no acute distress. Pulm:  Breathing unlabored. Psy:  Normal mood, congruent affect.  Back: No CVA tenderness. Abdomen: No suprapubic tenderness.  Imaging: No results found.  Assessment & Plan: 1.  Dysuria, suspect cystitis -Urine culture, presumptively treat with Macrobid.     Procedures: No procedures performed  No notes on file     PMFS History: Patient Active Problem List   Diagnosis Date Noted  . Allergic rhinitis 04/13/2019  . COPD (chronic obstructive pulmonary disease) (HCC) 04/13/2019  . Hypothyroid 04/13/2019  . Osteoporosis 04/13/2019  . RLS (restless legs syndrome) 04/13/2019  . Chronic ischemic heart disease 04/13/2019  . Spondylosis 04/13/2019  . Colitis 04/13/2019  . Spinal stenosis of lumbar region 04/13/2019  . Peripheral neuropathy 04/13/2019  . Lymphedema of leg 04/13/2019   Past Medical History:  Diagnosis Date  . Chronic kidney disease (CKD), active medical management without dialysis, stage 3 (moderate)   . COPD (chronic obstructive pulmonary  disease) (HCC)   . Hyperlipidemia   . Hypertension   . Hypothyroid   . Mitral valve prolapse     Family History  Problem Relation Age of Onset  . Breast cancer Mother   . CAD Father     Past Surgical History:  Procedure Laterality Date  . ABDOMINAL HYSTERECTOMY    . APPENDECTOMY    . BACK SURGERY    . BRAIN TUMOR EXCISION    . CHOLECYSTECTOMY     Social History   Occupational History  . Not on file  Tobacco Use  . Smoking status: Former Smoker    Types: Cigarettes, E-cigarettes  . Smokeless tobacco: Former Engineer, water and Sexual Activity  . Alcohol use: Never  . Drug use: Never  . Sexual activity: Not on file

## 2019-09-18 LAB — URINALYSIS W MICROSCOPIC + REFLEX CULTURE
Bilirubin Urine: NEGATIVE
Glucose, UA: NEGATIVE
Hgb urine dipstick: NEGATIVE
Hyaline Cast: NONE SEEN /LPF
Ketones, ur: NEGATIVE
Nitrites, Initial: POSITIVE — AB
Protein, ur: NEGATIVE
RBC / HPF: NONE SEEN /HPF (ref 0–2)
Specific Gravity, Urine: 1.018 (ref 1.001–1.03)
Squamous Epithelial / HPF: NONE SEEN /HPF (ref <=5)
pH: 6 (ref 5.0–8.0)

## 2019-09-18 LAB — URINE CULTURE
MICRO NUMBER:: 10524330
SPECIMEN QUALITY:: ADEQUATE

## 2019-09-18 LAB — CULTURE INDICATED

## 2019-09-20 ENCOUNTER — Encounter: Payer: Self-pay | Admitting: Family Medicine

## 2019-10-12 ENCOUNTER — Other Ambulatory Visit: Payer: Self-pay

## 2019-10-12 ENCOUNTER — Ambulatory Visit (INDEPENDENT_AMBULATORY_CARE_PROVIDER_SITE_OTHER): Payer: Medicare Other | Admitting: Family Medicine

## 2019-10-12 ENCOUNTER — Ambulatory Visit: Payer: Medicare Other | Admitting: Family Medicine

## 2019-10-12 ENCOUNTER — Encounter: Payer: Self-pay | Admitting: Family Medicine

## 2019-10-12 VITALS — BP 118/65 | HR 94

## 2019-10-12 DIAGNOSIS — E039 Hypothyroidism, unspecified: Secondary | ICD-10-CM | POA: Diagnosis not present

## 2019-10-12 DIAGNOSIS — K529 Noninfective gastroenteritis and colitis, unspecified: Secondary | ICD-10-CM

## 2019-10-12 DIAGNOSIS — E785 Hyperlipidemia, unspecified: Secondary | ICD-10-CM

## 2019-10-12 DIAGNOSIS — M810 Age-related osteoporosis without current pathological fracture: Secondary | ICD-10-CM

## 2019-10-12 DIAGNOSIS — J449 Chronic obstructive pulmonary disease, unspecified: Secondary | ICD-10-CM

## 2019-10-12 DIAGNOSIS — I259 Chronic ischemic heart disease, unspecified: Secondary | ICD-10-CM

## 2019-10-12 NOTE — Progress Notes (Signed)
Office Visit Note   Patient: Kiara Jones           Date of Birth: Sep 11, 1935           MRN: [id number] Visit Date: 10/12/2019 Requested by: Eunice Blase, MD [address] [place],  [place] [address] PCP: Eunice Blase, MD  Subjective: Chief Complaint  Patient presents with   6 months follow up, thyroid/vitamin D work up   memory loss over the past month (short-term)    HPI: She is here for routine monitoring of medical conditions.  She is taking Synthroid 75 mcg daily for hypothyroidism.  Feels good, no complaints other than recent short-term memory loss.  She states that she has had short-term memory troubles for a while, but in the past month it seems to have worsened.  She is not sure why.  Her recent urinary tract infection resolved with antibiotics.  She is not having any troubles with colitis related to that.  She tolerates Pravachol for hyperlipidemia.  From a blood pressure standpoint her readings are typically normal, but she has had a couple episodes of low blood pressure recently.  She is taking losartan 100 mg daily and amlodipine 2.5 mg twice daily.  COPD and heart disease remaine stable.               ROS:   All other systems were reviewed and are negative.  Objective: Vital Signs: BP 118/65    Pulse 94   Physical Exam:  General:  Alert and oriented, in no acute distress. Pulm:  Breathing unlabored. Psy:  Normal mood, congruent affect.  Neck: No thyromegaly, no carotid bruits. CV: Regular rate and rhythm without murmurs, rubs, or gallops.  No peripheral edema.  2+ radial and posterior tibial pulses. Lungs: Clear to auscultation throughout with no wheezing or areas of consolidation. Extremities: 2+ upper and lower DTRs.   Imaging: No results found.  Assessment & Plan: 1.  Hypothyroidism -Labs to evaluate.  2.  Short-term memory loss, etiology uncertain. -Check thyroid function, screen for anemia, make sure total cholesterol has not gotten  too low.  3.  Osteoporosis -We will check vitamin D levels.  4.  Hypertension -Blood pressure normal today. -Return in 6 months for routine visit.     Procedures: No procedures performed  No notes on file     PMFS History: Patient Active Problem List   Diagnosis Date Noted   Hyperlipidemia 10/12/2019   Allergic rhinitis 04/13/2019   Chronic obstructive pulmonary disease ([place]) 04/13/2019   Hypothyroidism 04/13/2019   Osteoporosis 04/13/2019   RLS (restless legs syndrome) 04/13/2019   Chronic ischemic heart disease 04/13/2019   Spondylosis 04/13/2019   Colitis 04/13/2019   Spinal stenosis of lumbar region 04/13/2019   Peripheral neuropathy 04/13/2019   Lymphedema of leg 04/13/2019   Past Medical History:  Diagnosis Date   Chronic kidney disease (CKD), active medical management without dialysis, stage 3 (moderate)    COPD (chronic obstructive pulmonary disease) (HCC)    Hyperlipidemia    Hypertension    Hypothyroid    Mitral valve prolapse     Family History  Problem Relation Age of Onset   Breast cancer Mother    CAD Father     Past Surgical History:  Procedure Laterality Date   ABDOMINAL HYSTERECTOMY     APPENDECTOMY     BACK SURGERY     BRAIN TUMOR EXCISION     CHOLECYSTECTOMY     Social History   Occupational History  Not on file  Tobacco Use   Smoking status: Former Smoker    Types: Cigarettes, E-cigarettes   Smokeless tobacco: Former Engineer, water and Sexual Activity   Alcohol use: Never   Drug use: Never   Sexual activity: Not on file

## 2019-10-13 ENCOUNTER — Telehealth: Payer: Self-pay | Admitting: Family Medicine

## 2019-10-13 LAB — CBC WITH DIFFERENTIAL/PLATELET
Absolute Monocytes: 533 cells/uL (ref 200–950)
Basophils Absolute: 31 cells/uL (ref 0–200)
Basophils Relative: 0.5 %
Eosinophils Absolute: 143 cells/uL (ref 15–500)
Eosinophils Relative: 2.3 %
HCT: 39 % (ref 35.0–45.0)
Hemoglobin: 13.3 g/dL (ref 11.7–15.5)
Lymphs Abs: 1314 cells/uL (ref 850–3900)
MCH: 31.4 pg (ref 27.0–33.0)
MCHC: 34.1 g/dL (ref 32.0–36.0)
MCV: 92 fL (ref 80.0–100.0)
MPV: 9.3 fL (ref 7.5–12.5)
Monocytes Relative: 8.6 %
Neutro Abs: 4179 cells/uL (ref 1500–7800)
Neutrophils Relative %: 67.4 %
Platelets: 287 10*3/uL (ref 140–400)
RBC: 4.24 10*6/uL (ref 3.80–5.10)
RDW: 13.8 % (ref 11.0–15.0)
Total Lymphocyte: 21.2 %
WBC: 6.2 10*3/uL (ref 3.8–10.8)

## 2019-10-13 LAB — COMPREHENSIVE METABOLIC PANEL
AG Ratio: 1.5 (calc) (ref 1.0–2.5)
ALT: 17 U/L (ref 6–29)
AST: 18 U/L (ref 10–35)
Albumin: 4.5 g/dL (ref 3.6–5.1)
Alkaline phosphatase (APISO): 52 U/L (ref 37–153)
BUN/Creatinine Ratio: 21 (calc) (ref 6–22)
BUN: 28 mg/dL — ABNORMAL HIGH (ref 7–25)
CO2: 30 mmol/L (ref 20–32)
Calcium: 10.7 mg/dL — ABNORMAL HIGH (ref 8.6–10.4)
Chloride: 99 mmol/L (ref 98–110)
Creat: 1.33 mg/dL — ABNORMAL HIGH (ref 0.60–0.88)
Globulin: 3.1 g/dL (calc) (ref 1.9–3.7)
Glucose, Bld: 133 mg/dL — ABNORMAL HIGH (ref 65–99)
Potassium: 3.4 mmol/L — ABNORMAL LOW (ref 3.5–5.3)
Sodium: 142 mmol/L (ref 135–146)
Total Bilirubin: 0.9 mg/dL (ref 0.2–1.2)
Total Protein: 7.6 g/dL (ref 6.1–8.1)

## 2019-10-13 LAB — THYROID PANEL WITH TSH
Free Thyroxine Index: 2.8 (ref 1.4–3.8)
T3 Uptake: 29 % (ref 22–35)
T4, Total: 9.6 ug/dL (ref 5.1–11.9)
TSH: 0.41 mIU/L (ref 0.40–4.50)

## 2019-10-13 LAB — LIPID PANEL
Cholesterol: 158 mg/dL (ref <200)
HDL: 70 mg/dL (ref >=50)
LDL Cholesterol (Calc): 69 mg/dL (calc)
Non-HDL Cholesterol (Calc): 88 mg/dL (calc) (ref <130)
Total CHOL/HDL Ratio: 2.3 (calc) (ref <5.0)
Triglycerides: 100 mg/dL (ref <150)

## 2019-10-13 LAB — VITAMIN D 25 HYDROXY (VIT D DEFICIENCY, FRACTURES): Vit D, 25-Hydroxy: 60 ng/mL (ref 30–100)

## 2019-10-13 NOTE — Telephone Encounter (Signed)
Labs show:  Thyroid looks perfect.  Vitamin D looks perfect.  Lipids look perfect.  Creatinine is slightly higher than last time (previously 1.24) at 1.33.  Will recheck in 3-4 months to be sure it's stable.

## 2019-10-19 ENCOUNTER — Telehealth: Payer: Self-pay | Admitting: Family Medicine

## 2019-10-19 NOTE — Telephone Encounter (Signed)
Opened in error

## 2019-12-06 ENCOUNTER — Telehealth: Payer: Self-pay | Admitting: Family Medicine

## 2019-12-06 MED ORDER — AZITHROMYCIN 250 MG PO TABS
ORAL_TABLET | ORAL | 0 refills | Status: DC
Start: 2019-12-06 — End: 2020-02-23

## 2019-12-06 NOTE — Telephone Encounter (Signed)
I called the patient to make sure she was able to pull up the MyChart message. Her brother-in-law was able to get it and read it to her. She understands she is to try all of the viral remedies first and take the antibiotic only if she worsens/fails to improve.

## 2019-12-06 NOTE — Telephone Encounter (Signed)
I called the patient to get more information: she has had a cough x 4 days, no longer productive - clear phlegm x 2 days and some whitish nasal discharge. No fever that she knows of, but she does feel hot then cold. She has been taking Robitussin DM - not helping. The patient said she gets these "cold" once a year. Allergic to PCN & sulfa and cannot take anything that would irritate her ulcer (such as NSAIDs). Please advise.

## 2019-12-06 NOTE — Addendum Note (Signed)
Addended by: Lillia Carmel on: 12/06/2019 04:57 PM   Modules accepted: Orders

## 2019-12-06 NOTE — Telephone Encounter (Signed)
Patient called.   She is requesting she be given antibiotics for some cold symptoms she's experiencing.   Call back: [phone]

## 2019-12-06 NOTE — Telephone Encounter (Signed)
Info sent through MyChart.

## 2019-12-15 ENCOUNTER — Other Ambulatory Visit: Payer: Self-pay | Admitting: Family Medicine

## 2019-12-15 ENCOUNTER — Telehealth: Payer: Self-pay | Admitting: Family Medicine

## 2019-12-15 MED ORDER — LEVOFLOXACIN 250 MG PO TABS: 250.0000 mg | ORAL_TABLET | Freq: Every day | ORAL | 0 refills | Status: DC

## 2019-12-15 NOTE — Telephone Encounter (Signed)
I called. The patient woke up with a sore throat on 12/09/19. She tried the remedies suggested and did get the zithromax filled - took the last one on 12/11/19. The patient complains of nasal and chest congestion that "doesn't move" - cannot blow it out or cough it up. She does have mucinex at home, but was unsure if she should take it. No fever that she is aware of (has no thermometer at home). Please advise.

## 2019-12-15 NOTE — Telephone Encounter (Signed)
Left this information on the patient's voice mail. Advised her to call us back with any questions/concerns.

## 2019-12-15 NOTE — Telephone Encounter (Signed)
Mucinex would be a good one to try.  Will call in Levaquin.  She should consider getting tested for Covid.

## 2019-12-15 NOTE — Telephone Encounter (Signed)
Pt called wanting to make Terri aware that she has not gotten better, she took the remedies and then the antibiotic when she woke up a couple days later with a sore throat. Pt would like a CB to discuss what's next  [phone]

## 2019-12-28 ENCOUNTER — Telehealth: Payer: Self-pay | Admitting: Family Medicine

## 2019-12-28 MED ORDER — LOSARTAN POTASSIUM 100 MG PO TABS
100.0000 mg | ORAL_TABLET | Freq: Every day | ORAL | 1 refills | Status: DC
Start: 2019-12-28 — End: 2020-06-26

## 2019-12-28 MED ORDER — CARBIDOPA-LEVODOPA ER 25-100 MG PO TBCR: 2.0000 | EXTENDED_RELEASE_TABLET | Freq: Every day | ORAL | 1 refills | Status: AC

## 2019-12-28 MED ORDER — LEVOTHYROXINE SODIUM 75 MCG PO TABS
75.0000 ug | ORAL_TABLET | Freq: Every day | ORAL | 1 refills | Status: DC
Start: 2019-12-28 — End: 2020-06-26

## 2019-12-28 MED ORDER — HYDROCHLOROTHIAZIDE 12.5 MG PO TABS
12.5000 mg | ORAL_TABLET | Freq: Every day | ORAL | 1 refills | Status: DC
Start: 2019-12-28 — End: 2020-06-26

## 2019-12-28 NOTE — Telephone Encounter (Signed)
I called and advised the patient. 

## 2019-12-28 NOTE — Telephone Encounter (Signed)
Please advise 

## 2019-12-28 NOTE — Telephone Encounter (Signed)
Rxs sent

## 2019-12-28 NOTE — Telephone Encounter (Signed)
Pt called stating she needs her Levothyroxine, Losartan, Hydrochlorothiazide, and lastly her cardiopa-levodopa to be authorized and refilled. Pt would like a CB when this has been done  [phone]

## 2020-01-03 ENCOUNTER — Telehealth: Payer: Self-pay | Admitting: Family Medicine

## 2020-01-03 DIAGNOSIS — J449 Chronic obstructive pulmonary disease, unspecified: Secondary | ICD-10-CM

## 2020-01-03 NOTE — Telephone Encounter (Signed)
Orders placed.

## 2020-01-03 NOTE — Telephone Encounter (Signed)
Patient called asking to speak to Terri. Pt states she has questions for Terri. Patient phone number is [phone].

## 2020-01-03 NOTE — Telephone Encounter (Signed)
The patient would like to be referred to another pulmonologist. She has been seeing one at [facility] for 3 months now, and has been having trouble getting her medication from her each month. Please advise.

## 2020-01-05 NOTE — Telephone Encounter (Signed)
I called the patient to let her know we are referring her to [facility]. That is where we tried to send her in December, but that office had difficulty reaching her by phone and so closed the referral. During that time, the patient was having issues with her one & only phone line - I also had difficulty reaching her at times. That has now been fixed and she is easily reachable now. The patient did say another doctor at [facility] filled her medications for 3 months (in the absence of her doctor), so that should give her some leeway with getting in with another office.

## 2020-01-23 ENCOUNTER — Other Ambulatory Visit: Payer: Self-pay | Admitting: Family Medicine

## 2020-01-26 ENCOUNTER — Encounter: Payer: Self-pay | Admitting: Physical Medicine and Rehabilitation

## 2020-01-26 ENCOUNTER — Ambulatory Visit (INDEPENDENT_AMBULATORY_CARE_PROVIDER_SITE_OTHER): Payer: Medicare Other | Admitting: Physical Medicine and Rehabilitation

## 2020-01-26 ENCOUNTER — Other Ambulatory Visit: Payer: Self-pay

## 2020-01-26 VITALS — BP 145/78 | HR 86

## 2020-01-26 DIAGNOSIS — Z981 Arthrodesis status: Secondary | ICD-10-CM

## 2020-01-26 DIAGNOSIS — G894 Chronic pain syndrome: Secondary | ICD-10-CM | POA: Diagnosis not present

## 2020-01-26 DIAGNOSIS — M5442 Lumbago with sciatica, left side: Secondary | ICD-10-CM | POA: Diagnosis not present

## 2020-01-26 DIAGNOSIS — F1190 Opioid use, unspecified, uncomplicated: Secondary | ICD-10-CM

## 2020-01-26 DIAGNOSIS — M5441 Lumbago with sciatica, right side: Secondary | ICD-10-CM

## 2020-01-26 DIAGNOSIS — M961 Postlaminectomy syndrome, not elsewhere classified: Secondary | ICD-10-CM

## 2020-01-26 DIAGNOSIS — I259 Chronic ischemic heart disease, unspecified: Secondary | ICD-10-CM | POA: Diagnosis not present

## 2020-01-26 DIAGNOSIS — Z4889 Encounter for other specified surgical aftercare: Secondary | ICD-10-CM

## 2020-01-26 DIAGNOSIS — G8929 Other chronic pain: Secondary | ICD-10-CM

## 2020-01-26 DIAGNOSIS — T85192S Other mechanical complication of implanted electronic neurostimulator (electrode) of spinal cord, sequela: Secondary | ICD-10-CM

## 2020-01-26 DIAGNOSIS — G629 Polyneuropathy, unspecified: Secondary | ICD-10-CM | POA: Diagnosis not present

## 2020-01-26 DIAGNOSIS — T85610S Breakdown (mechanical) of epidural and subdural infusion catheter, sequela: Secondary | ICD-10-CM

## 2020-01-26 NOTE — Progress Notes (Signed)
Pt state lower back the travels down both leg. Pt state she in pain all the time. Pt state pain meds oxycodone to help ease the pain.  Numeric Pain Rating Scale and Functional Assessment Average Pain 6 Pain Right Now 6 My pain is constant and sharp Pain is worse with: walking, bending and sitting Pain improves with: rest and medication   In the last MONTH (on 0-10 scale) has pain interfered with the following?  1. General activity like being  able to carry out your everyday physical activities such as walking, climbing stairs, carrying groceries, or moving a chair?  Rating(7)  2. Relation with others like being able to carry out your usual social activities and roles such as  activities at home, at work and in your community. Rating(6)  3. Enjoyment of life such that you have  been bothered by emotional problems such as feeling anxious, depressed or irritable?  Rating(7)

## 2020-01-31 ENCOUNTER — Telehealth: Payer: Self-pay | Admitting: Family Medicine

## 2020-01-31 MED ORDER — AMLODIPINE BESYLATE 2.5 MG PO TABS
2.5000 mg | ORAL_TABLET | Freq: Two times a day (BID) | ORAL | 2 refills | Status: DC
Start: 2020-01-31 — End: 2020-10-09

## 2020-01-31 NOTE — Telephone Encounter (Signed)
Patient called. She would like a refill on amlodipine. Her call back number is [phone]

## 2020-01-31 NOTE — Telephone Encounter (Signed)
Sent!

## 2020-01-31 NOTE — Telephone Encounter (Signed)
I called and advised the patient. 

## 2020-02-01 ENCOUNTER — Encounter: Payer: Self-pay | Admitting: Pulmonary Disease

## 2020-02-01 ENCOUNTER — Telehealth: Payer: Self-pay | Admitting: *Deleted

## 2020-02-01 NOTE — Telephone Encounter (Signed)
Pt is scheduled on Thurs Oct 14 at 2pm

## 2020-02-01 NOTE — Telephone Encounter (Signed)
-----   Message from Rip Harbour, [place] sent at 01/31/2020  5:48 PM EDT ----- Regarding: pulmonology referral Can you see if there has been any movement on this referral? The patient is curious, since it was from 1 month ago.

## 2020-02-03 ENCOUNTER — Institutional Professional Consult (permissible substitution): Payer: Medicare Other | Admitting: Internal Medicine

## 2020-02-16 ENCOUNTER — Ambulatory Visit
Admission: RE | Admit: 2020-02-16 | Discharge: 2020-02-16 | Disposition: A | Discharge status: Home / Self Care | Payer: Medicare Other | Source: Ambulatory Visit | Attending: Physical Medicine and Rehabilitation | Admitting: Physical Medicine and Rehabilitation

## 2020-02-23 ENCOUNTER — Other Ambulatory Visit: Payer: Self-pay

## 2020-02-23 ENCOUNTER — Ambulatory Visit (INDEPENDENT_AMBULATORY_CARE_PROVIDER_SITE_OTHER): Payer: Medicare Other | Admitting: Pulmonary Disease

## 2020-02-23 ENCOUNTER — Encounter: Payer: Self-pay | Admitting: Pulmonary Disease

## 2020-02-23 VITALS — BP 126/68 | HR 102 | Temp 98.2°F | Ht <= 58 in | Wt 100.4 lb

## 2020-02-23 DIAGNOSIS — E039 Hypothyroidism, unspecified: Secondary | ICD-10-CM

## 2020-02-23 DIAGNOSIS — G2581 Restless legs syndrome: Secondary | ICD-10-CM

## 2020-02-23 DIAGNOSIS — E785 Hyperlipidemia, unspecified: Secondary | ICD-10-CM | POA: Diagnosis not present

## 2020-02-23 DIAGNOSIS — I10 Essential (primary) hypertension: Secondary | ICD-10-CM

## 2020-02-23 DIAGNOSIS — J449 Chronic obstructive pulmonary disease, unspecified: Secondary | ICD-10-CM | POA: Diagnosis not present

## 2020-02-23 MED ORDER — BREO ELLIPTA 200-25 MCG/INH IN AEPB: 1.0000 | INHALATION_SPRAY | Freq: Every day | RESPIRATORY_TRACT | 3 refills | Status: AC

## 2020-02-23 NOTE — Progress Notes (Signed)
re  Synopsis: Referred in nov 2021 for COPD by Lavada MesiHilts, Michael, MD  Subjective:   PATIENT ID: Kiara Jones GENDER: female DOB: 1935-07-03, MRN: [id number]  Chief Complaint  Patient presents with   Consult    pt trying to est with pulmnologist    PMH of COPD, HTN, HLD, MVP. She has had COPD for a long time. She currently takes albuterol and BREO. Former smoker, she quit smoking 15 years ago, smoking started at age [age] yo, off and on through the years. [place] moved from [place] and relocated here. She was initially seen here at [facility]. Currently the patient is doing well.  She has no respiratory complaints today.  She is compliant with her current inhalers.  She needs to establish care with a new primary pulmonologist.  She is to [facility] [place] [facility] [place]  We will request records.   Past Medical History:  Diagnosis Date   Chronic kidney disease (CKD), active medical management without dialysis, stage 3 (moderate) (HCC)    COPD (chronic obstructive pulmonary disease) (HCC)    Hyperlipidemia    Hypertension    Hypothyroid    Mitral valve prolapse      Family History  Problem Relation Age of Onset   Breast cancer Mother    CAD Father      Past Surgical History:  Procedure Laterality Date   ABDOMINAL HYSTERECTOMY     APPENDECTOMY     BACK SURGERY     BRAIN TUMOR EXCISION     CHOLECYSTECTOMY      Social History   Socioeconomic History   Marital status: Widowed    Spouse name: Not on file   Number of children: Not on file   Years of education: Not on file   Highest education level: Not on file  Occupational History   Not on file  Tobacco Use   Smoking status: Former Smoker    Types: Cigarettes, E-cigarettes   Smokeless tobacco: Former NeurosurgeonUser  Substance and Sexual Activity   Alcohol use: Never   Drug use: Never   Sexual activity: Not on file  Other Topics Concern   Not on file  Social History Narrative   Not on  file   Social Determinants of Health   Financial Resource Strain:    Difficulty of Paying Living Expenses: Not on file  Food Insecurity:    Worried About Programme researcher, broadcasting/film/videounning Out of Food in the Last Year: Not on file   [employer] Out of Food in the Last Year: Not on file  Transportation Needs:    Lack of Transportation (Medical): Not on file   Lack of Transportation (Non-Medical): Not on file  Physical Activity:    Days of Exercise per Week: Not on file   Minutes of Exercise per Session: Not on file  Stress:    Feeling of Stress : Not on file  Social Connections:    Frequency of Communication with Friends and Family: Not on file   Frequency of Social Gatherings with Friends and Family: Not on file   Attends Religious Services: Not on file   Active Member of Clubs or Organizations: Not on file   Attends BankerClub or Organization Meetings: Not on file   Marital Status: Not on file  Intimate Partner Violence:    Fear of Current or Ex-Partner: Not on file   Emotionally Abused: Not on file   Physically Abused: Not on file   Sexually Abused: Not on file  Allergies  Allergen Reactions   Gabapentin Shortness Of Breath and Other (See Comments)    Fatigue, blurred vision   Lyrica [Pregabalin] Shortness Of Breath and Other (See Comments)    Blurred vision, fatigue   Alprazolam Other (See Comments)    Sick to stomach, tremors inside & out, loss of appetite   Codeine Other (See Comments)    Heartburn, ulcer upset indigestion   Nsaids Other (See Comments)    "upsets my ulcer"    Penicillins Other (See Comments)    Cold sweat, almost passed out   Sertraline Other (See Comments)    Sick to stomach, tremors inside & out, loss of appetite   Tape    Silicone Rash    Silicone-based pain patches - caused a rash   Sulfa Antibiotics Rash     Outpatient Medications Prior to Visit  Medication Sig Dispense Refill   albuterol (VENTOLIN HFA) 108 (90 Base) MCG/ACT inhaler       amLODipine (NORVASC) 2.5 MG tablet Take 1 tablet (2.5 mg total) by mouth 2 (two) times daily. 180 tablet 2   BREO ELLIPTA 200-25 MCG/INH AEPB Inhale 1 puff into the lungs daily.     busPIRone (BUSPAR) 15 MG tablet TAKE 1 TABLET BY MOUTH TWICE A DAY 180 tablet 1   Carbidopa-Levodopa ER (SINEMET CR) 25-100 MG tablet controlled release Take 2 tablets by mouth daily. 180 tablet 1   hydrochlorothiazide (HYDRODIURIL) 12.5 MG tablet Take 1 tablet (12.5 mg total) by mouth daily. 90 tablet 1   levofloxacin (LEVAQUIN) 250 MG tablet Take 1 tablet (250 mg total) by mouth daily. 10 tablet 0   levothyroxine (SYNTHROID) 75 MCG tablet Take 1 tablet (75 mcg total) by mouth daily before breakfast. 90 tablet 1   losartan (COZAAR) 100 MG tablet Take 1 tablet (100 mg total) by mouth daily. 90 tablet 1   morphine (MS CONTIN) 30 MG 12 hr tablet Take 30 mg by mouth 2 (two) times daily.     nitrofurantoin, macrocrystal-monohydrate, (MACROBID) 100 MG capsule Take 1 capsule (100 mg total) by mouth 2 (two) times daily. 14 capsule 0   oxyCODONE-acetaminophen (PERCOCET) 10-325 MG tablet Take 1 tablet by mouth every 4 (four) hours as needed.     pravastatin (PRAVACHOL) 80 MG tablet Take 1 tablet (80 mg total) by mouth daily. 90 tablet 3   temazepam (RESTORIL) 30 MG capsule Take 1 capsule (30 mg total) by mouth at bedtime as needed for sleep. 90 capsule 0   azithromycin (ZITHROMAX) 250 MG tablet 2 PO x 1 day, then 1 PO qd x 4 more days. 6 each 0   megestrol (MEGACE) 40 MG/ML suspension Take 800 mg by mouth daily.     No facility-administered medications prior to visit.    Review of Systems  Constitutional: Negative for chills, fever, malaise/fatigue and weight loss.  HENT: Negative for hearing loss, sore throat and tinnitus.   Eyes: Negative for blurred vision and double vision.  Respiratory: Negative for cough, hemoptysis, sputum production, shortness of breath, wheezing and stridor.   Cardiovascular:  Negative for chest pain, palpitations, orthopnea, leg swelling and PND.  Gastrointestinal: Negative for abdominal pain, constipation, diarrhea, heartburn, nausea and vomiting.  Genitourinary: Negative for dysuria, hematuria and urgency.  Musculoskeletal: Negative for joint pain and myalgias.  Skin: Negative for itching and rash.  Neurological: Negative for dizziness, tingling, weakness and headaches.  Endo/Heme/Allergies: Negative for environmental allergies. Does not bruise/bleed easily.  Psychiatric/Behavioral: Negative for depression. The patient is not  nervous/anxious and does not have insomnia.   All other systems reviewed and are negative.    Objective:  Physical Exam Vitals reviewed.  Constitutional:      General: She is not in acute distress.    Appearance: She is well-developed.  HENT:     Head: Normocephalic and atraumatic.     Mouth/Throat:     Pharynx: No oropharyngeal exudate.  Eyes:     Conjunctiva/sclera: Conjunctivae normal.     Pupils: Pupils are equal, round, and reactive to light.  Neck:     Vascular: No JVD.     Trachea: No tracheal deviation.     Comments: Loss of supraclavicular fat Cardiovascular:     Rate and Rhythm: Normal rate and regular rhythm.     Heart sounds: S1 normal and S2 normal.     Comments: Distant heart tones Pulmonary:     Effort: No tachypnea or accessory muscle usage.     Breath sounds: No stridor. Decreased breath sounds (throughout all lung fields) present. No wheezing, rhonchi or rales.  Abdominal:     General: Bowel sounds are normal. There is no distension.     Palpations: Abdomen is soft.     Tenderness: There is no abdominal tenderness.  Musculoskeletal:        General: Deformity (muscle wasting ) present.  Skin:    General: Skin is warm and dry.     Capillary Refill: Capillary refill takes less than 2 seconds.     Findings: No rash.  Neurological:     Mental Status: She is alert and oriented to person, place, and time.    Psychiatric:        Behavior: Behavior normal.      Vitals:   02/23/20 1425 02/23/20 1429  BP: 126/68 126/68  Pulse:  (!) 102  Temp:  98.2 F (36.8 C)  TempSrc:  Oral  SpO2:  91%  Weight:  100 lb 6.4 oz (45.5 kg)  Height:  4\' 9"  (1.448 m)   91% on RA BMI Readings from Last 3 Encounters:  02/23/20 21.73 kg/m  08/09/19 23.16 kg/m   Wt Readings from Last 3 Encounters:  02/23/20 100 lb 6.4 oz (45.5 kg)  08/09/19 110 lb 12.8 oz (50.3 kg)     CBC    Component Value Date/Time   WBC 6.2 10/12/2019 1654   RBC 4.24 10/12/2019 1654   HGB 13.3 10/12/2019 1654   HCT 39.0 10/12/2019 1654   PLT 287 10/12/2019 1654   MCV 92.0 10/12/2019 1654   MCH 31.4 10/12/2019 1654   MCHC 34.1 10/12/2019 1654   RDW 13.8 10/12/2019 1654   LYMPHSABS 1,314 10/12/2019 1654   EOSABS 143 10/12/2019 1654   BASOSABS 31 10/12/2019 1654     Chest Imaging: No chest imaging for review at this time.  Pulmonary Functions Testing Results: No flowsheet data found.  FeNO:   Pathology:   Echocardiogram:   Heart Catheterization:     Assessment & Plan:     ICD-10-CM   1. Chronic obstructive pulmonary disease, unspecified COPD type (HCC)  J44.9   2. Primary hypertension  I10   3. RLS (restless legs syndrome)  G25.81   4. Hyperlipidemia, unspecified hyperlipidemia type  E78.5   5. Hypothyroidism, unspecified type  E03.9     Discussion: This is a 84 year old female, history of smoking, quit 15 years ago, smoked for approximately 50+ years, at her max was less than 1 pack/day.  She had been diagnosed with COPD  and has been able on Breo 200 for a long time now.  She likes her current inhaler regimen.  She never had any exacerbations or hospitalizations related to COPD.  Plan:  Request records from Dr. Edyth Gunnels, Pulmonologist in [facility]  Request records from [facility] here in [place].  Refills given today for Breo 200. Continue albuterol as needed for shortness of breath and  wheezing. Return to clinic in 1 year or as needed. At that time will have full PFTs completed as she states it has been several years since she had them looked at.    Current Outpatient Medications:    albuterol (VENTOLIN HFA) 108 (90 Base) MCG/ACT inhaler, , Disp: , Rfl:    amLODipine (NORVASC) 2.5 MG tablet, Take 1 tablet (2.5 mg total) by mouth 2 (two) times daily., Disp: 180 tablet, Rfl: 2   BREO ELLIPTA 200-25 MCG/INH AEPB, Inhale 1 puff into the lungs daily., Disp: , Rfl:    busPIRone (BUSPAR) 15 MG tablet, TAKE 1 TABLET BY MOUTH TWICE A DAY, Disp: 180 tablet, Rfl: 1   Carbidopa-Levodopa ER (SINEMET CR) 25-100 MG tablet controlled release, Take 2 tablets by mouth daily., Disp: 180 tablet, Rfl: 1   hydrochlorothiazide (HYDRODIURIL) 12.5 MG tablet, Take 1 tablet (12.5 mg total) by mouth daily., Disp: 90 tablet, Rfl: 1   levofloxacin (LEVAQUIN) 250 MG tablet, Take 1 tablet (250 mg total) by mouth daily., Disp: 10 tablet, Rfl: 0   levothyroxine (SYNTHROID) 75 MCG tablet, Take 1 tablet (75 mcg total) by mouth daily before breakfast., Disp: 90 tablet, Rfl: 1   losartan (COZAAR) 100 MG tablet, Take 1 tablet (100 mg total) by mouth daily., Disp: 90 tablet, Rfl: 1   morphine (MS CONTIN) 30 MG 12 hr tablet, Take 30 mg by mouth 2 (two) times daily., Disp: , Rfl:    nitrofurantoin, macrocrystal-monohydrate, (MACROBID) 100 MG capsule, Take 1 capsule (100 mg total) by mouth 2 (two) times daily., Disp: 14 capsule, Rfl: 0   oxyCODONE-acetaminophen (PERCOCET) 10-325 MG tablet, Take 1 tablet by mouth every 4 (four) hours as needed., Disp: , Rfl:    pravastatin (PRAVACHOL) 80 MG tablet, Take 1 tablet (80 mg total) by mouth daily., Disp: 90 tablet, Rfl: 3   temazepam (RESTORIL) 30 MG capsule, Take 1 capsule (30 mg total) by mouth at bedtime as needed for sleep., Disp: 90 capsule, Rfl: 0  Josephine Igo, DO [facility] 02/23/2020 2:31 PM

## 2020-02-23 NOTE — Patient Instructions (Signed)
Thank you for visiting Dr. Tonia Brooms at [facility]. Today we recommend the following:  Meds ordered this encounter  Medications  . BREO ELLIPTA 200-25 MCG/INH AEPB    Sig: Inhale 1 puff into the lungs daily.    Dispense:  90 each    Refill:  3   Return in about 1 year (around 02/22/2021), or if symptoms worsen or fail to improve, for with APP or Dr. Tonia Brooms.    Please do your part to reduce the spread of COVID-19.

## 2020-02-27 ENCOUNTER — Encounter: Payer: Self-pay | Admitting: Physical Medicine and Rehabilitation

## 2020-02-27 NOTE — Progress Notes (Signed)
Kiara Jones - 84 y.o. female MRN [id number]  Date of birth: 11/12/1935  Office Visit Note: Visit Date: 01/26/2020 PCP: Lavada Mesi, MD Referred by: Lavada Mesi, MD  Subjective: Chief Complaint  Patient presents with  . Lower Back - Pain  . Right Hip - Pain  . Left Hip - Pain  . Left Leg - Pain, Numbness  . Right Leg - Numbness, Pain   HPI: Kiara Jones is a 84 y.o. female who comes in today For essentially self-referral for discussion of spinal cord stimulator and pain management.  She is however treated in the office by Dr. Lavada Mesi.  She began seeing Dr. Prince Rome in December 2020 to establish care.  She tells me today that she was born in the area but moved to [place].  She has a pretty significant and long history of lumbar pain and chronic pain with history of lumbar surgery as well as tried and failed stimulator.  She has also had tried and failed morphine intrathecal pump.  She is currently being seen by Weyman Croon, for pain medication management.  She takes 30 mg of morphine extended release twice a day along with oxycodone 10 mg for breakthrough.  She also has a history of anxiety and does take temazepam.  Obviously this is a high risk combination that needs to be monitored.  She saw one of the presentations that I did on advanced pain management procedures and was asking about spinal cord stimulators once again.  We had a long discussion on this today and the fact that she has had these in the past and is really failed at this point.  We do not have any current imaging of what she has in place or where the leads are located in particular.  We do have an x-ray showing thoracic leads.  She reports pain across the lower back and travels down both legs.  Pain medication does seem to help to a degree.  She has allergies or intolerances to most other pain medications including gabapentin and Lyrica and nonsteroidal anti-inflammatories.  She has  had multiple bouts of physical therapy and chiropractic care in the past.  She has a complicated case of what sounds like prior meningioma resection and she also suffers from COPD.  Review of Systems  Constitutional: Positive for malaise/fatigue.  Musculoskeletal: Positive for back pain and joint pain.  Neurological: Positive for tingling.  All other systems reviewed and are negative.  Otherwise per HPI.  Assessment & Plan: Visit Diagnoses:  1. Chronic bilateral low back pain with bilateral sciatica   2. Peripheral polyneuropathy   3. Post laminectomy syndrome   4. Chronic pain syndrome   5. Opioid use, unspecified, uncomplicated   6. Spinal cord stimulator dysfunction, sequela   7. Malfunction of intrathecal infusion pump, sequela   8. Arthrodesis status   9. Encounter for other specified surgical aftercare     Plan: Findings:  Chronic recalcitrant pain particularly of the lower back and bilateral legs with history of lumbar surgery and failed spinal cord stimulators as well as morphine intrathecal pump.  A lot of this was performed in [place].  At this point she will continue with pain management at [facility].  We did discuss that we just do not have the facility here to manage medications to that degree.  I would like to get CT scans of the thoracic and lumbar area just to see if there is anything new or worrisome but  also to characterize the current leads that were left in place.    Meds & Orders: No orders of the defined types were placed in this encounter.   Orders Placed This Encounter  Procedures  . CT THORACIC SPINE WO CONTRAST  . CT LUMBAR SPINE WO CONTRAST    Follow-up: Return for CT scan review after completion.   Procedures: No procedures performed  No notes on file   Clinical History: No specialty comments available.   She reports that she has quit smoking. Her smoking use included cigarettes and e-cigarettes. She has quit using smokeless tobacco. No  results for input(s): HGBA1C, LABURIC in the last 8760 hours.  Objective:  VS:  HT:    WT:   BMI:     BP:(!) 145/78  HR:86bpm  TEMP: ( )  RESP:  Physical Exam Vitals and nursing note reviewed.  Constitutional:      General: She is not in acute distress.    Appearance: Normal appearance. She is well-developed.     Comments: Very thin  HENT:     Head: Normocephalic and atraumatic.     Nose: Nose normal.     Mouth/Throat:     Mouth: Mucous membranes are moist.     Pharynx: Oropharynx is clear.  Eyes:     Conjunctiva/sclera: Conjunctivae normal.     Pupils: Pupils are equal, round, and reactive to light.  Cardiovascular:     Rate and Rhythm: Regular rhythm.  Pulmonary:     Effort: Pulmonary effort is normal. No respiratory distress.  Abdominal:     General: There is no distension.     Palpations: Abdomen is soft.     Tenderness: There is no guarding.  Musculoskeletal:     Cervical back: Normal range of motion and neck supple.     Right lower leg: No edema.     Left lower leg: No edema.     Comments: Patient ambulates without aid.  She has good distal strength without clonus. Patient somewhat slow to rise from a seated position to full extension.  There is concordant low back pain with facet loading and lumbar spine extension rotation.  There are no definitive trigger points but the patient is somewhat tender across the lower back and PSIS.  There is no pain with hip rotation.   Skin:    General: Skin is warm and dry.     Findings: No erythema or rash.  Neurological:     General: No focal deficit present.     Mental Status: She is alert and oriented to person, place, and time.     Motor: No abnormal muscle tone.     Coordination: Coordination normal.     Gait: Gait normal.  Psychiatric:        Mood and Affect: Mood normal.        Behavior: Behavior normal.        Thought Content: Thought content normal.     Ortho Exam  Imaging: No results found.  Past  Medical/Family/Surgical/Social History: Medications & Allergies reviewed per EMR, new medications updated. Patient Active Problem List   Diagnosis Date Noted  . Hyperlipidemia 10/12/2019  . Allergic rhinitis 04/13/2019  . Chronic obstructive pulmonary disease (HCC) 04/13/2019  . Hypothyroidism 04/13/2019  . Osteoporosis 04/13/2019  . RLS (restless legs syndrome) 04/13/2019  . Chronic ischemic heart disease 04/13/2019  . Spondylosis 04/13/2019  . Colitis 04/13/2019  . Spinal stenosis of lumbar region 04/13/2019  . Peripheral neuropathy 04/13/2019  .  Lymphedema of leg 04/13/2019   Past Medical History:  Diagnosis Date  . Chronic kidney disease (CKD), active medical management without dialysis, stage 3 (moderate) (HCC)   . COPD (chronic obstructive pulmonary disease) (HCC)   . Hyperlipidemia   . Hypertension   . Hypothyroid   . Mitral valve prolapse    Family History  Problem Relation Age of Onset  . Breast cancer Mother   . CAD Father    Past Surgical History:  Procedure Laterality Date  . ABDOMINAL HYSTERECTOMY    . APPENDECTOMY    . BACK SURGERY    . BRAIN TUMOR EXCISION    . CHOLECYSTECTOMY     Social History   Occupational History  . Not on file  Tobacco Use  . Smoking status: Former Smoker    Types: Cigarettes, E-cigarettes  . Smokeless tobacco: Former Engineer, water and Sexual Activity  . Alcohol use: Never  . Drug use: Never  . Sexual activity: Not on file

## 2020-03-01 ENCOUNTER — Other Ambulatory Visit: Payer: Self-pay | Admitting: Family Medicine

## 2020-03-22 ENCOUNTER — Other Ambulatory Visit: Payer: Self-pay

## 2020-03-22 ENCOUNTER — Ambulatory Visit (INDEPENDENT_AMBULATORY_CARE_PROVIDER_SITE_OTHER): Payer: Medicare Other | Admitting: Physical Medicine and Rehabilitation

## 2020-03-22 VITALS — BP 146/66 | HR 76

## 2020-03-22 DIAGNOSIS — G8929 Other chronic pain: Secondary | ICD-10-CM | POA: Diagnosis not present

## 2020-03-22 DIAGNOSIS — M5442 Lumbago with sciatica, left side: Secondary | ICD-10-CM

## 2020-03-22 DIAGNOSIS — G894 Chronic pain syndrome: Secondary | ICD-10-CM | POA: Diagnosis not present

## 2020-03-22 DIAGNOSIS — M5441 Lumbago with sciatica, right side: Secondary | ICD-10-CM | POA: Diagnosis not present

## 2020-03-22 DIAGNOSIS — M5416 Radiculopathy, lumbar region: Secondary | ICD-10-CM

## 2020-03-22 DIAGNOSIS — I259 Chronic ischemic heart disease, unspecified: Secondary | ICD-10-CM | POA: Diagnosis not present

## 2020-03-22 DIAGNOSIS — M961 Postlaminectomy syndrome, not elsewhere classified: Secondary | ICD-10-CM | POA: Diagnosis not present

## 2020-03-22 NOTE — Progress Notes (Signed)
Low back pain and pain in both legs. Pain in creases with standing and walking. Numeric Pain Rating Scale and Functional Assessment Average Pain 6   In the last MONTH (on 0-10 scale) has pain interfered with the following?  1. General activity like being  able to carry out your everyday physical activities such as walking, climbing stairs, carrying groceries, or moving a chair?  Rating(10)

## 2020-04-12 ENCOUNTER — Encounter: Payer: Self-pay | Admitting: Family Medicine

## 2020-04-12 ENCOUNTER — Other Ambulatory Visit: Payer: Self-pay

## 2020-04-12 ENCOUNTER — Ambulatory Visit (INDEPENDENT_AMBULATORY_CARE_PROVIDER_SITE_OTHER): Payer: Medicare Other | Admitting: Family Medicine

## 2020-04-12 VITALS — BP 126/64 | HR 81 | Ht <= 58 in | Wt 106.4 lb

## 2020-04-12 DIAGNOSIS — E785 Hyperlipidemia, unspecified: Secondary | ICD-10-CM

## 2020-04-12 DIAGNOSIS — R7989 Other specified abnormal findings of blood chemistry: Secondary | ICD-10-CM | POA: Diagnosis not present

## 2020-04-12 DIAGNOSIS — G2581 Restless legs syndrome: Secondary | ICD-10-CM | POA: Diagnosis not present

## 2020-04-12 DIAGNOSIS — E039 Hypothyroidism, unspecified: Secondary | ICD-10-CM

## 2020-04-12 MED ORDER — DICLOFENAC SODIUM 1 % EX GEL: 4.0000 g | Freq: Four times a day (QID) | CUTANEOUS | 6 refills | Status: DC | PRN

## 2020-04-12 MED ORDER — DICLOFENAC SODIUM 1 % EX GEL: 4.0000 g | Freq: Four times a day (QID) | CUTANEOUS | 6 refills | Status: AC | PRN

## 2020-04-12 NOTE — Progress Notes (Signed)
Office Visit Note   Patient: Kiara Jones           Date of Birth: 09-09-1935           MRN: [id number] Visit Date: 04/12/2020 Requested by: Kiara Mesi, MD [address] [place],  [place] [address] PCP: Kiara Mesi, MD  Subjective: Chief Complaint  Patient presents with  . Other    6 months follow up of medical issues    HPI: She is here for monitoring of medical conditions.  From a thyroid standpoint she is feeling well on 75 mcg Synthroid daily.  Energy level is limited by lack of ability to exercise related to her back problems.  She has been eating "whenever she can" to try to gain weight.  She would like to have her cholesterol levels checked to be sure she is not getting too high.  Both of her hands been hurting, she thinks from arthritis related to cold weather.  We are due to monitor her creatinine level which was elevated last time.               ROS:   All other systems were reviewed and are negative.  Objective: Vital Signs: BP 126/64   Pulse 81   Ht 4\' 9"  (1.448 m)   Wt 106 lb 6.4 oz (48.3 kg)   BMI 23.02 kg/m   Physical Exam:  General:  Alert and oriented, in no acute distress. Pulm:  Breathing unlabored. Psy:  Normal mood, congruent affect.  Neck: no thyromegaly or nodules. CV: Regular rate and rhythm without murmurs, rubs, or gallops.  1+ bilateral lower extremity peripheral edema.  2+ radial and posterior tibial pulses. Lungs: Clear to auscultation throughout with no wheezing or areas of consolidation. Hands: She seems to have tenderness and a little bit of swelling at the thumb and index CMC joints.    Imaging: No results found.  Assessment & Plan: 1.  Hypothyroidism, clinically stable -Recheck levels today.  2.  Hyperlipidemia -Recheck levels today.  3.  Bilateral hand osteoarthritis -Voltaren gel as needed.  4.  Elevated creatinine -Recheck levels today.  Follow-up in 6 months.     Procedures: No procedures performed         PMFS History: Patient Active Problem List   Diagnosis Date Noted  . Hyperlipidemia 10/12/2019  . Allergic rhinitis 04/13/2019  . Chronic obstructive pulmonary disease (HCC) 04/13/2019  . Hypothyroidism 04/13/2019  . Osteoporosis 04/13/2019  . RLS (restless legs syndrome) 04/13/2019  . Chronic ischemic heart disease 04/13/2019  . Spondylosis 04/13/2019  . Colitis 04/13/2019  . Spinal stenosis of lumbar region 04/13/2019  . Peripheral neuropathy 04/13/2019  . Lymphedema of leg 04/13/2019   Past Medical History:  Diagnosis Date  . Chronic kidney disease (CKD), active medical management without dialysis, stage 3 (moderate) (HCC)   . COPD (chronic obstructive pulmonary disease) (HCC)   . Hyperlipidemia   . Hypertension   . Hypothyroid   . Mitral valve prolapse     Family History  Problem Relation Age of Onset  . Breast cancer Mother   . CAD Father     Past Surgical History:  Procedure Laterality Date  . ABDOMINAL HYSTERECTOMY    . APPENDECTOMY    . BACK SURGERY    . BRAIN TUMOR EXCISION    . CHOLECYSTECTOMY     Social History   Occupational History  . Not on file  Tobacco Use  . Smoking status: Former Smoker    Types: Cigarettes,  E-cigarettes  . Smokeless tobacco: Former Engineer, water and Sexual Activity  . Alcohol use: Never  . Drug use: Never  . Sexual activity: Not on file

## 2020-04-13 ENCOUNTER — Telehealth: Payer: Self-pay | Admitting: Family Medicine

## 2020-04-13 LAB — THYROID PANEL WITH TSH
Free Thyroxine Index: 2.6 (ref 1.4–3.8)
T3 Uptake: 32 % (ref 22–35)
T4, Total: 8 ug/dL (ref 5.1–11.9)
TSH: 1.17 mIU/L (ref 0.40–4.50)

## 2020-04-13 LAB — CBC WITH DIFFERENTIAL/PLATELET
Absolute Monocytes: 439 cells/uL (ref 200–950)
Basophils Absolute: 41 cells/uL (ref 0–200)
Basophils Relative: 0.8 %
Eosinophils Absolute: 117 cells/uL (ref 15–500)
Eosinophils Relative: 2.3 %
HCT: 40.7 % (ref 35.0–45.0)
Hemoglobin: 13.7 g/dL (ref 11.7–15.5)
Lymphs Abs: 1341 cells/uL (ref 850–3900)
MCH: 31.6 pg (ref 27.0–33.0)
MCHC: 33.7 g/dL (ref 32.0–36.0)
MCV: 93.8 fL (ref 80.0–100.0)
MPV: 9.6 fL (ref 7.5–12.5)
Monocytes Relative: 8.6 %
Neutro Abs: 3162 cells/uL (ref 1500–7800)
Neutrophils Relative %: 62 %
Platelets: 246 10*3/uL (ref 140–400)
RBC: 4.34 10*6/uL (ref 3.80–5.10)
RDW: 11.7 % (ref 11.0–15.0)
Total Lymphocyte: 26.3 %
WBC: 5.1 10*3/uL (ref 3.8–10.8)

## 2020-04-13 LAB — COMPREHENSIVE METABOLIC PANEL
AG Ratio: 1.7 (calc) (ref 1.0–2.5)
ALT: 11 U/L (ref 6–29)
AST: 18 U/L (ref 10–35)
Albumin: 4.5 g/dL (ref 3.6–5.1)
Alkaline phosphatase (APISO): 59 U/L (ref 37–153)
BUN/Creatinine Ratio: 21 (calc) (ref 6–22)
BUN: 22 mg/dL (ref 7–25)
CO2: 27 mmol/L (ref 20–32)
Calcium: 9.8 mg/dL (ref 8.6–10.4)
Chloride: 97 mmol/L — ABNORMAL LOW (ref 98–110)
Creat: 1.04 mg/dL — ABNORMAL HIGH (ref 0.60–0.88)
Globulin: 2.7 g/dL (calc) (ref 1.9–3.7)
Glucose, Bld: 92 mg/dL (ref 65–99)
Potassium: 3.8 mmol/L (ref 3.5–5.3)
Sodium: 137 mmol/L (ref 135–146)
Total Bilirubin: 0.8 mg/dL (ref 0.2–1.2)
Total Protein: 7.2 g/dL (ref 6.1–8.1)

## 2020-04-13 LAB — LIPID PANEL
Cholesterol: 162 mg/dL (ref <200)
HDL: 73 mg/dL (ref >=50)
LDL Cholesterol (Calc): 76 mg/dL (calc)
Non-HDL Cholesterol (Calc): 89 mg/dL (calc) (ref <130)
Total CHOL/HDL Ratio: 2.2 (calc) (ref <5.0)
Triglycerides: 57 mg/dL (ref <150)

## 2020-04-13 NOTE — Telephone Encounter (Signed)
Labs are notable for the following:  Lipids look great.  CBC is normal.  Creatinine/kidney function is significantly improved.  Recheck in 3 to 4 months.  Thyroid function looks perfect.

## 2020-05-10 ENCOUNTER — Telehealth: Payer: Self-pay | Admitting: Family Medicine

## 2020-05-10 MED ORDER — HYDROCOD POLST-CPM POLST ER 10-8 MG/5ML PO SUER: 5.0000 mL | Freq: Two times a day (BID) | ORAL | 0 refills | Status: DC | PRN

## 2020-05-10 MED ORDER — BENZONATATE 100 MG PO CAPS: 100.0000 mg | ORAL_CAPSULE | Freq: Three times a day (TID) | ORAL | 1 refills | Status: DC | PRN

## 2020-05-10 NOTE — Telephone Encounter (Signed)
Patient called with symptoms of covid/cold. Patient is asking for a call back with instructions on what medication she can take. Please call patient at [phone]. Patient states she do not think she has covid

## 2020-05-10 NOTE — Telephone Encounter (Signed)
I spoke with the patient and [name] (brother-in-law), advising them of the need for the covid test. He is going to call [facility] since she already goes there and they have an urgent care there, for the test. They want to know what she needs to do about this cough until she gets the results back (especially if it is the PCR test, which can take up to 72 hours to get the results).

## 2020-05-10 NOTE — Telephone Encounter (Signed)
Sent in Tessalon perles and Tussionex.

## 2020-05-10 NOTE — Telephone Encounter (Signed)
Needs to go somewhere and get tested for covid.  If positive, she'll need to monitor her oxygen levels with a pulse oximeter.    If test is negative, I'll call in some different remedies.

## 2020-05-10 NOTE — Telephone Encounter (Signed)
I called: the patient developed a sore throat a few days ago. Today, she woke up with a cough (nonproductive). No chest congestion. She is feeling weak and has a slight headache. No loss of taste/smell. She is having chills, but has no thermometer to check for fever. She has not been out of the house for a week now [name] is the one that goes out and about -- he is not sick). She started taking some Robitussin D this morning. She also has some Corididin HBP, but has not started that yet.   Please advise on anything else she should be trying/doing.

## 2020-05-10 NOTE — Telephone Encounter (Signed)
I called and advised the patient of the 2 cough medications having been sent in to her pharmacy. She said she is going to [facility] to be tested and will keep [place] informed of the results.

## 2020-05-10 NOTE — Telephone Encounter (Signed)
Left this info on the voice mail, but asked the patient to call me back to further discuss.

## 2020-05-11 ENCOUNTER — Other Ambulatory Visit: Payer: Self-pay

## 2020-05-11 ENCOUNTER — Telehealth: Payer: Self-pay | Admitting: Family Medicine

## 2020-05-11 MED ORDER — AZITHROMYCIN 250 MG PO TABS: ORAL_TABLET | ORAL | 0 refills | Status: DC

## 2020-05-11 NOTE — Telephone Encounter (Signed)
Ok to call in zpack

## 2020-05-11 NOTE — Telephone Encounter (Signed)
Patient called advised Dr Prince Rome sent in two Rx for an antibiotic  and pain medicine Hydrocodone. Patient advised she is taking Percocet and Morphine and is not sure whether she should take the Hydrocodone. The number to contact patient is [phone]

## 2020-05-11 NOTE — Telephone Encounter (Signed)
I called and spoke with Ms. Bevans: she could not find a place that was open for testing yesterday, so she bought a home test for covid - it was negative. She is still taking the coricidin and now taking the tessalon perles. She is afraid to take the Tussionex because of the opioids she is already on. Her sore throat is some better today. Her biggest worry is that she will develop bronchitis, as she tends to do this time of year. The patient asks if she could get a zpak - this was the only thing that helped her last year.

## 2020-05-11 NOTE — Telephone Encounter (Signed)
I called and advised the patient that Dr. Prince Rome prescribed a zpack for her. I called it in to [facility] on [facility]. Advised her to call us if she does not improve and definitely if she worsens.

## 2020-05-16 ENCOUNTER — Other Ambulatory Visit: Payer: Self-pay | Admitting: Family Medicine

## 2020-05-16 ENCOUNTER — Telehealth: Payer: Self-pay | Admitting: Family Medicine

## 2020-05-16 MED ORDER — AZITHROMYCIN 250 MG PO TABS: ORAL_TABLET | ORAL | 0 refills | Status: DC

## 2020-05-16 NOTE — Telephone Encounter (Signed)
Pt called stating the Z pack has helped a little but not much and she was wondering if it takes time for it to start making a big difference? And could she take another if after awhile it's still not making her feel noticeably better? Pt would like a CB with answer  [phone]

## 2020-05-16 NOTE — Telephone Encounter (Signed)
Please advise 

## 2020-05-16 NOTE — Telephone Encounter (Signed)
I called the patient and advised her. She voiced understanding.

## 2020-05-16 NOTE — Telephone Encounter (Signed)
Most likely her illness is from a virus, and antibiotics like z-pack typically don't help much with viruses.  Viruses just have to run their course.    I will call in another z-pack for her, but not sure whether it will help.

## 2020-06-25 ENCOUNTER — Other Ambulatory Visit: Payer: Self-pay | Admitting: Family Medicine

## 2020-06-25 ENCOUNTER — Encounter: Payer: Self-pay | Admitting: Family Medicine

## 2020-06-26 MED ORDER — LEVOTHYROXINE SODIUM 75 MCG PO TABS: 75.0000 ug | ORAL_TABLET | Freq: Every day | ORAL | 1 refills | Status: DC

## 2020-06-26 MED ORDER — HYDROCHLOROTHIAZIDE 12.5 MG PO TABS: 12.5000 mg | ORAL_TABLET | Freq: Every day | ORAL | 1 refills | Status: DC

## 2020-06-26 MED ORDER — LOSARTAN POTASSIUM 100 MG PO TABS: 100.0000 mg | ORAL_TABLET | Freq: Every day | ORAL | 1 refills | Status: DC

## 2020-07-24 ENCOUNTER — Other Ambulatory Visit: Payer: Self-pay | Admitting: Family Medicine

## 2020-07-24 ENCOUNTER — Encounter: Payer: Self-pay | Admitting: Physical Medicine and Rehabilitation

## 2020-07-24 NOTE — Progress Notes (Signed)
Kiara Jones - 85 y.o. female MRN [id number]  Date of birth: [birth year]  Office Visit Note: Visit Date: 03/22/2020 PCP: Lavada Mesi, MD Referred by: Lavada Mesi, MD  Subjective: Chief Complaint  Patient presents with  . Lower Back - Pain   HPI: Kiara Jones is a 85 y.o. female who comes in today For evaluation and management and review of CT scan obtained after last visit.  She is followed by Dr. Casimiro Needle hilts at this point.  She has a chronic history of chronic pain with multiple drug intolerances and chronic opioid use.  She has had a history of lumbar scoliosis and lumbar back surgery.  CT scan shows decompression from L4-S1 with spinal cord stimulator leads in place but they seem to be lumbar located and in the thecal sac and not epidural.  There are no lesions in the thoracic spine.  Images and spine models used to illustrate this shown to the patient today.  I do not know if this represents a failed attempt at placement but typically these are placed in the thoracic epidural space and not the lumbar intrathecal space.  She continues to have 6 out of 10 low back pain and bilateral leg pain.  Her pain increases with standing and walking.  CT scan does not show any high-grade central stenosis.  She denies any new symptoms since we last saw her.  She continues to take long-acting morphine and short-acting oxycodone for breakthrough.  This is managed by [facility] medical.  Review of Systems  Musculoskeletal: Positive for back pain and joint pain.  Neurological: Positive for tingling.   Otherwise per HPI.  Assessment & Plan: Visit Diagnoses:    ICD-10-CM   1. Chronic bilateral low back pain with bilateral sciatica  M54.42    M54.41    G89.29   2. Lumbar radiculopathy  M54.16   3. Post laminectomy syndrome  M96.1   4. Chronic pain syndrome  G89.4      Plan: Findings:  Chronic recalcitrant low back pain and bilateral hip and leg pain with a diagnosis of polyneuropathy but also  with lumbar scoliosis and prior lumbar laminectomies multiple level.  CT scan shows intrathecally placed leads only in the lumbar region.  At this point I do not think there is any specific injection that would just be a miraculous benefit to her although it could be looked that she has had multiple injections in the past is not really interested in that.  She might do well with a well-placed spinal cord stimulator but with the complications and prior leads in place she might benefit from referral to [facility].  She will continue with current medication treatment.  She continues with current home exercise plan.    Meds & Orders: No orders of the defined types were placed in this encounter.  No orders of the defined types were placed in this encounter.   Follow-up: Return if symptoms worsen or fail to improve.   Procedures: No procedures performed      Clinical History: CT THORACIC AND LUMBAR SPINE WITHOUT CONTRAST  TECHNIQUE: Multidetector CT imaging of the thoracic and lumbar spine was performed without contrast. Multiplanar CT image reconstructions were also generated.  COMPARISON:  None.  FINDINGS: CT THORACIC SPINE FINDINGS  Alignment: Normal.  Vertebrae: No acute fracture or focal pathologic process.  Paraspinal and other soft tissues: Negative.  Disc levels: No spinal canal stenosis  CT LUMBAR SPINE FINDINGS  Segmentation: 5 lumbar type vertebrae.  Alignment:  Levoscoliosis with apex at L4. Grade 1 retrolisthesis at L2-3 and L3-4.  Vertebrae: Posterior decompression at the L4-S1 levels.  Paraspinal and other soft tissues: There is a metallic lead implanted in the dorsal soft tissues that enters the thecal sac at the L5 level and travels superiorly to the L1 level. Tip appears to be in the epidural space. 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  Disc levels:  L1-2: Disc bulge without spinal canal stenosis.  L2-3:  Facet arthrosis and small disc bulge with endplate spurring. No spinal canal stenosis.  L3-4: Disc bulge and endplate spurring with facet arthrosis. No spinal canal stenosis. Severe bilateral foraminal stenosis.  L4-5: Posterior decompression.  No stenosis.  L5-S1: Posterior decompression.  No stenosis.  IMPRESSION: 1. Stimulator lead travels within the thecal sac from L5 to L1. The tip may be in the dorsal epidural space, but most of the course is intrathecal. 2. 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 3. Severe bilateral L3-4 foraminal stenosis. 4. Levoscoliosis with apex at L4. Grade 1 retrolisthesis at L2-3 and L3-4. 5.  Aortic atherosclerosis (ICD10-I70.0).   Electronically Signed   By: Deatra Robinson M.D.   On: 02/16/2020 21:56   She reports that she has quit smoking. Her smoking use included cigarettes and e-cigarettes. She has quit using smokeless tobacco. No results for input(s): HGBA1C, LABURIC in the last 8760 hours.  Objective:  VS:  HT:    WT:   BMI:     BP:(!) 146/66  HR:76bpm  TEMP: ( )  RESP:  Physical Exam Vitals and nursing note reviewed.  Constitutional:      General: She is not in acute distress.    Appearance: Normal appearance. She is not ill-appearing.  HENT:     Head: Normocephalic and atraumatic.     Right Ear: External ear normal.     Left Ear: External ear normal.  Eyes:     Extraocular Movements: Extraocular movements intact.  Cardiovascular:     Rate and Rhythm: Normal rate.     Pulses: Normal pulses.  Pulmonary:     Effort: Pulmonary effort is normal. No respiratory distress.  Abdominal:     General: There is no distension.     Palpations: Abdomen is soft.  Musculoskeletal:        General: Tenderness present.     Cervical back: Neck supple.     Right lower leg: No edema.     Left lower leg: No edema.     Comments: Patient has good distal strength with no pain over the greater trochanters.  No clonus or focal  weakness.  Skin:    Findings: No erythema, lesion or rash.  Neurological:     General: No focal deficit present.     Mental Status: She is alert and oriented to person, place, and time.     Sensory: No sensory deficit.     Motor: No weakness or abnormal muscle tone.     Coordination: Coordination normal.  Psychiatric:        Mood and Affect: Mood normal.        Behavior: Behavior normal.     Ortho Exam  Imaging: No results found.  Past Medical/Family/Surgical/Social History: Medications & Allergies reviewed per EMR, new medications updated. Patient Active Problem List   Diagnosis Date Noted  . Hyperlipidemia 10/12/2019  . Allergic rhinitis 04/13/2019  . Chronic obstructive pulmonary disease (HCC) 04/13/2019  . Hypothyroidism 04/13/2019  . Osteoporosis 04/13/2019  . RLS (restless legs syndrome) 04/13/2019  .  Chronic ischemic heart disease 04/13/2019  . Spondylosis 04/13/2019  . Colitis 04/13/2019  . Spinal stenosis of lumbar region 04/13/2019  . Peripheral neuropathy 04/13/2019  . Lymphedema of leg 04/13/2019   Past Medical History:  Diagnosis Date  . Chronic kidney disease (CKD), active medical management without dialysis, stage 3 (moderate) (HCC)   . COPD (chronic obstructive pulmonary disease) (HCC)   . Hyperlipidemia   . Hypertension   . Hypothyroid   . Mitral valve prolapse    Family History  Problem Relation Age of Onset  . Breast cancer Mother   . CAD Father    Past Surgical History:  Procedure Laterality Date  . ABDOMINAL HYSTERECTOMY    . APPENDECTOMY    . BACK SURGERY    . BRAIN TUMOR EXCISION    . CHOLECYSTECTOMY     Social History   Occupational History  . Not on file  Tobacco Use  . Smoking status: Former Smoker    Types: Cigarettes, E-cigarettes  . Smokeless tobacco: Former Engineer, water and Sexual Activity  . Alcohol use: Never  . Drug use: Never  . Sexual activity: Not on file

## 2020-08-07 ENCOUNTER — Encounter: Payer: Self-pay | Admitting: Family Medicine

## 2020-08-07 ENCOUNTER — Telehealth: Payer: Self-pay

## 2020-08-07 NOTE — Telephone Encounter (Signed)
MyChart message sent to her with a question about which antidepressant she has tolerated in the past.

## 2020-08-07 NOTE — Telephone Encounter (Signed)
I also tried calling the patient - reached her voice mail. Advised her to check for the MyChart message from Dr. Prince Rome - - she can respond to him through that, or she can call me back about the medication(s).

## 2020-08-07 NOTE — Telephone Encounter (Signed)
Patient called she sated she had to put her cat down and she is depressed she is requesting rx to help her with her depression until her appointment next month call back:[phone]

## 2020-08-08 MED ORDER — ESCITALOPRAM OXALATE 5 MG PO TABS: 2.5000 mg | ORAL_TABLET | Freq: Every day | ORAL | 3 refills | Status: AC

## 2020-08-08 NOTE — Telephone Encounter (Signed)
The patient has corresponded with Dr. Prince Rome through MyChart regarding the medication.

## 2020-08-11 NOTE — Progress Notes (Deleted)
HPI: FU CAD. Patient previously resided in [place].  I do not have all records available.  She has had previous catheterization but has never received stents.  She also has a history of mitral valve prolapse. Since last seen,   Current Outpatient Medications  Medication Sig Dispense Refill  . albuterol (VENTOLIN HFA) 108 (90 Base) MCG/ACT inhaler     . amLODipine (NORVASC) 2.5 MG tablet Take 1 tablet (2.5 mg total) by mouth 2 (two) times daily. 180 tablet 2  . azithromycin (ZITHROMAX) 250 MG tablet Take 2 po today and then 1 po daily until gone. 6 each 0  . azithromycin (ZITHROMAX) 250 MG tablet 2 PO x 1 day, then 1 PO qd x 4 more days. 6 each 0  . benzonatate (TESSALON PERLES) 100 MG capsule Take 1-2 capsules (100-200 mg total) by mouth 3 (three) times daily as needed for cough. 60 capsule 1  . busPIRone (BUSPAR) 15 MG tablet TAKE ONE TABLET BY MOUTH TWICE A DAY 180 tablet 1  . Carbidopa-Levodopa ER (SINEMET CR) 25-100 MG tablet controlled release Take 2 tablets by mouth daily. 180 tablet 1  . chlorpheniramine-HYDROcodone (TUSSIONEX PENNKINETIC ER) 10-8 MG/5ML SUER Take 5 mLs by mouth every 12 (twelve) hours as needed for cough. 115 mL 0  . diclofenac Sodium (VOLTAREN) 1 % GEL Apply 4 g topically 4 (four) times daily as needed. 500 g 6  . escitalopram (LEXAPRO) 5 MG tablet Take 0.5 tablets (2.5 mg total) by mouth daily. 30 tablet 3  . hydrochlorothiazide (HYDRODIURIL) 12.5 MG tablet Take 1 tablet (12.5 mg total) by mouth daily. 90 tablet 1  . levothyroxine (SYNTHROID) 75 MCG tablet Take 1 tablet (75 mcg total) by mouth daily before breakfast. 90 tablet 1  . losartan (COZAAR) 100 MG tablet Take 1 tablet (100 mg total) by mouth daily. 90 tablet 1  . morphine (MS CONTIN) 30 MG 12 hr tablet Take 30 mg by mouth 2 (two) times daily.    Marland Kitchen oxyCODONE-acetaminophen (PERCOCET) 10-325 MG tablet Take 1 tablet by mouth every 4 (four) hours as needed.    . pravastatin (PRAVACHOL) 80 MG tablet TAKE 1  TABLET BY MOUTH EVERY DAY 90 tablet 2  . temazepam (RESTORIL) 30 MG capsule Take 1 capsule (30 mg total) by mouth at bedtime as needed for sleep. 90 capsule 0   No current facility-administered medications for this visit.     Past Medical History:  Diagnosis Date  . Chronic kidney disease (CKD), active medical management without dialysis, stage 3 (moderate) (HCC)   . COPD (chronic obstructive pulmonary disease) (HCC)   . Hyperlipidemia   . Hypertension   . Hypothyroid   . Mitral valve prolapse     Past Surgical History:  Procedure Laterality Date  . ABDOMINAL HYSTERECTOMY    . APPENDECTOMY    . BACK SURGERY    . BRAIN TUMOR EXCISION    . CHOLECYSTECTOMY      Social History   Socioeconomic History  . Marital status: Widowed    Spouse name: Not on file  . Number of children: Not on file  . Years of education: Not on file  . Highest education level: Not on file  Occupational History  . Not on file  Tobacco Use  . Smoking status: Former Smoker    Types: Cigarettes, E-cigarettes  . Smokeless tobacco: Former Engineer, water and Sexual Activity  . Alcohol use: Never  . Drug use: Never  . Sexual activity:  Not on file  Other Topics Concern  . Not on file  Social History Narrative  . Not on file   Social Determinants of Health   Financial Resource Strain: Not on file  Food Insecurity: Not on file  Transportation Needs: Not on file  Physical Activity: Not on file  Stress: Not on file  Social Connections: Not on file  Intimate Partner Violence: Not on file    Family History  Problem Relation Age of Onset  . Breast cancer Mother   . CAD Father     ROS: no fevers or chills, productive cough, hemoptysis, dysphasia, odynophagia, melena, hematochezia, dysuria, hematuria, rash, seizure activity, orthopnea, PND, pedal edema, claudication. Remaining systems are negative.  Physical Exam: Well-developed well-nourished in no acute distress.  Skin is warm and dry.   HEENT is normal.  Neck is supple.  Chest is clear to auscultation with normal expansion.  Cardiovascular exam is regular rate and rhythm.  Abdominal exam nontender or distended. No masses palpated. Extremities show no edema. neuro grossly intact  ECG- personally reviewed  A/P  1 coronary artery disease-we will continue statin and aspirin.  Records from [place] not available.  2 hypertension-blood pressure controlled.  Continue present medications.  3 hyperlipidemia-continue statin.  4 MVP-we will repeat echo  Olga Millers, MD

## 2020-08-14 ENCOUNTER — Telehealth: Payer: Self-pay | Admitting: Family Medicine

## 2020-08-14 MED ORDER — BENZONATATE 100 MG PO CAPS: 100.0000 mg | ORAL_CAPSULE | Freq: Three times a day (TID) | ORAL | 1 refills | Status: DC | PRN

## 2020-08-14 NOTE — Telephone Encounter (Signed)
Tessalon perles Rx sent.  If it doesn't help, she can let me know.

## 2020-08-14 NOTE — Telephone Encounter (Signed)
I called and left voice mail with this info.

## 2020-08-14 NOTE — Telephone Encounter (Signed)
Patient called stating she has a cough and want to know what to take to get rid of it. Patient said it's like she has bronchitis and is congested. Patient said she does not have a fever and do not feel sick. Patient said she has been depressed since her kitty passed away Jul 22, 2018. The number to contact patient is [phone]

## 2020-08-17 ENCOUNTER — Telehealth: Payer: Self-pay | Admitting: Family Medicine

## 2020-08-17 NOTE — Telephone Encounter (Signed)
Pt called stating the perles didn't help and it's been 3 days. She would like a CB to advise her wether she try the rx for another day or is there something different Dr.Hilts would want her to try?  [phone]

## 2020-08-17 NOTE — Telephone Encounter (Signed)
Please advise 

## 2020-08-18 ENCOUNTER — Ambulatory Visit: Payer: Medicare Other | Admitting: Cardiology

## 2020-08-18 ENCOUNTER — Other Ambulatory Visit: Payer: Self-pay | Admitting: Family Medicine

## 2020-08-18 MED ORDER — HYDROCODONE BIT-HOMATROP MBR 5-1.5 MG/5ML PO SOLN: 5.0000 mL | Freq: Four times a day (QID) | ORAL | 0 refills | Status: DC | PRN

## 2020-08-18 NOTE — Telephone Encounter (Signed)
Stronger Rx sent.  Can cause drowsiness, constipation.

## 2020-08-18 NOTE — Telephone Encounter (Signed)
I called the patient and advised her of the new medication and possible side effects. Kiara Jones has gone to pick up the Rx now from the pharmacy. Advised her to call us back as needed.

## 2020-08-25 ENCOUNTER — Telehealth: Payer: Self-pay | Admitting: Family Medicine

## 2020-08-25 NOTE — Telephone Encounter (Signed)
Dr Prince Rome is out of the office. Is this something you can help with?

## 2020-08-25 NOTE — Telephone Encounter (Signed)
Unfortunatley not in my [place] .

## 2020-08-25 NOTE — Telephone Encounter (Signed)
Called patient no answer LMOM.

## 2020-08-25 NOTE — Telephone Encounter (Signed)
Per [place] go to urgent care.

## 2020-08-25 NOTE — Telephone Encounter (Signed)
Pt called back regarding the voice mail that was left. Stating that it is extremely unacceptable that she cant have her cough medication refilled. Then the husband got on the phone to say that we should have somebody to help with his pts medication as I tried to advise him of Kiara Jones request he said " Screw You" and hung up the phone.

## 2020-08-25 NOTE — Telephone Encounter (Signed)
Patient called advised she still have the cough and the phlegm is still yellow and very thick. Patient said she finished the cough medicine. Patient asked if she can get an antibiotic. The number to contact patient is [phone]

## 2020-08-28 ENCOUNTER — Telehealth: Payer: Self-pay | Admitting: Family Medicine

## 2020-08-28 ENCOUNTER — Other Ambulatory Visit: Payer: Self-pay | Admitting: Family Medicine

## 2020-08-28 MED ORDER — AZITHROMYCIN 250 MG PO TABS: ORAL_TABLET | ORAL | 0 refills | Status: DC

## 2020-08-28 NOTE — Telephone Encounter (Signed)
Please advise on this.  

## 2020-08-28 NOTE — Telephone Encounter (Signed)
I called and advised Todd (brother-in-law). 

## 2020-08-28 NOTE — Telephone Encounter (Signed)
Please advise 

## 2020-08-28 NOTE — Telephone Encounter (Signed)
Patient called requesting a prescription of antibiotics for her cough. She states she had taken 3 different meds and none is working. She feels the antibiotics will clear it up. Please send to pharmacy on file and call patient when called in. Patient phone number is [phone].

## 2020-08-28 NOTE — Progress Notes (Signed)
I called and advised [name] (brother-in-law).

## 2020-09-06 ENCOUNTER — Telehealth: Payer: Self-pay | Admitting: Family Medicine

## 2020-09-06 MED ORDER — AZITHROMYCIN 250 MG PO TABS: ORAL_TABLET | ORAL | 0 refills | Status: DC

## 2020-09-06 NOTE — Telephone Encounter (Signed)
Please advise 

## 2020-09-06 NOTE — Telephone Encounter (Signed)
Pt called stating she finished her Z pack and the cough is still there as well as yellow flem when she coughs. Pt would like a CB to be advised if Dr. Prince Rome will want to send in another Z pack since last time this happened it took two to clear it up, or if there is something else he wants to try first.   [phone]

## 2020-09-06 NOTE — Telephone Encounter (Signed)
Rx sent 

## 2020-09-06 NOTE — Telephone Encounter (Signed)
I called and advised the patient. 

## 2020-10-09 ENCOUNTER — Other Ambulatory Visit: Payer: Self-pay

## 2020-10-09 ENCOUNTER — Encounter: Payer: Self-pay | Admitting: Family Medicine

## 2020-10-09 ENCOUNTER — Ambulatory Visit (INDEPENDENT_AMBULATORY_CARE_PROVIDER_SITE_OTHER): Payer: Medicare Other | Admitting: Family Medicine

## 2020-10-09 VITALS — BP 153/82 | HR 78 | Ht <= 58 in | Wt 103.4 lb

## 2020-10-09 DIAGNOSIS — E785 Hyperlipidemia, unspecified: Secondary | ICD-10-CM

## 2020-10-09 DIAGNOSIS — E039 Hypothyroidism, unspecified: Secondary | ICD-10-CM

## 2020-10-09 DIAGNOSIS — R32 Unspecified urinary incontinence: Secondary | ICD-10-CM

## 2020-10-09 DIAGNOSIS — J449 Chronic obstructive pulmonary disease, unspecified: Secondary | ICD-10-CM

## 2020-10-09 DIAGNOSIS — R7989 Other specified abnormal findings of blood chemistry: Secondary | ICD-10-CM

## 2020-10-09 DIAGNOSIS — I10 Essential (primary) hypertension: Secondary | ICD-10-CM | POA: Diagnosis not present

## 2020-10-09 DIAGNOSIS — M65331 Trigger finger, right middle finger: Secondary | ICD-10-CM

## 2020-10-09 MED ORDER — AMLODIPINE BESYLATE 2.5 MG PO TABS: 2.5000 mg | ORAL_TABLET | Freq: Two times a day (BID) | ORAL | 2 refills | Status: AC

## 2020-10-09 MED ORDER — LEVOTHYROXINE SODIUM 75 MCG PO TABS: 75.0000 ug | ORAL_TABLET | Freq: Every day | ORAL | 2 refills | Status: AC

## 2020-10-09 MED ORDER — BENZONATATE 100 MG PO CAPS: 100.0000 mg | ORAL_CAPSULE | Freq: Three times a day (TID) | ORAL | 1 refills | Status: DC | PRN

## 2020-10-09 MED ORDER — PRAVASTATIN SODIUM 80 MG PO TABS: 80.0000 mg | ORAL_TABLET | Freq: Every day | ORAL | 2 refills | Status: AC

## 2020-10-09 MED ORDER — LOSARTAN POTASSIUM 100 MG PO TABS: 100.0000 mg | ORAL_TABLET | Freq: Every day | ORAL | 2 refills | Status: AC

## 2020-10-09 MED ORDER — HYDROCHLOROTHIAZIDE 12.5 MG PO TABS: 12.5000 mg | ORAL_TABLET | Freq: Every day | ORAL | 2 refills | Status: AC

## 2020-10-09 MED ORDER — PREDNISONE 10 MG PO TABS: ORAL_TABLET | ORAL | 0 refills | Status: DC

## 2020-10-09 NOTE — Progress Notes (Signed)
Office Visit Note   Patient: Kiara Jones           Date of Birth: 07-06-1935           MRN: [id number] Visit Date: 10/09/2020 Requested by: Lavada Mesi, MD [address] [place],  [place] [address] PCP: Lavada Mesi, MD  Subjective: Chief Complaint  Patient presents with   Other    6 months follow up of medical conditions/medication refills. Continues to have cough p/o yellow phlegm. The index and middle fingers of each hand are locking up.    HPI: She is here for routine monitoring of medical conditions.  From a thyroid standpoint she is stable on levothyroxine 75 mcg.  Blood pressure has generally been well controlled with losartan and hydrochlorothiazide along with amlodipine.  She is continued to have a cough for the past 2 months.  She states that the phlegm is generally clear to white, but sometimes is yellow.  No fevers or chills. Z-pack didn't help.                ROS:   All other systems were reviewed and are negative.  Objective: Vital Signs: BP (!) 153/82 (BP Location: Right Arm, Patient Position: Sitting, Cuff Size: Small)   Pulse 78   Ht 4\' 9"  (1.448 m)   Wt 103 lb 6.4 oz (46.9 kg)   SpO2 92%   BMI 22.38 kg/m   Physical Exam:  General:  Alert and oriented, in no acute distress. Pulm:  Breathing unlabored. Psy:  Normal mood, congruent affect.  Neck:  No LA or thyromegaly. CV: Regular rate and rhythm without murmurs, rubs, or gallops.  No peripheral edema.  2+ radial and posterior tibial pulses. Lungs: Clear to auscultation throughout with no wheezing or areas of consolidation.    Imaging: No results found.  Assessment & Plan:  Ongoing cough - Prednisone taper.  2.  Hypothyroid - Labs today.  3.  Hypertension - Refills today.       Procedures: No procedures performed        PMFS History: Patient Active Problem List   Diagnosis Date Noted   Hypertension 10/09/2020   Hyperlipidemia 10/12/2019   Allergic rhinitis 04/13/2019    Chronic obstructive pulmonary disease (HCC) 04/13/2019   Hypothyroidism 04/13/2019   Osteoporosis 04/13/2019   RLS (restless legs syndrome) 04/13/2019   Chronic ischemic heart disease 04/13/2019   Spondylosis 04/13/2019   Colitis 04/13/2019   Spinal stenosis of lumbar region 04/13/2019   Peripheral neuropathy 04/13/2019   Lymphedema of leg 04/13/2019   Past Medical History:  Diagnosis Date   Chronic kidney disease (CKD), active medical management without dialysis, stage 3 (moderate) (HCC)    COPD (chronic obstructive pulmonary disease) (HCC)    Hyperlipidemia    Hypertension    Hypothyroid    Mitral valve prolapse     Family History  Problem Relation Age of Onset   Breast cancer Mother    CAD Father     Past Surgical History:  Procedure Laterality Date   ABDOMINAL HYSTERECTOMY     APPENDECTOMY     BACK SURGERY     BRAIN TUMOR EXCISION     CHOLECYSTECTOMY     Social History   Occupational History   Not on file  Tobacco Use   Smoking status: Former    Pack years: 0.00    Types: Cigarettes, E-cigarettes   Smokeless tobacco: Former  Substance and Sexual Activity   Alcohol use: Never   Drug use:  Never   Sexual activity: Not on file

## 2020-10-10 ENCOUNTER — Ambulatory Visit: Payer: Medicare Other | Admitting: Family Medicine

## 2020-10-11 ENCOUNTER — Ambulatory Visit: Payer: Medicare Other | Admitting: Family Medicine

## 2020-10-12 LAB — LIPID PANEL
Cholesterol: 166 mg/dL (ref <200)
HDL: 89 mg/dL (ref >=50)
LDL Cholesterol (Calc): 61 mg/dL (calc)
Non-HDL Cholesterol (Calc): 77 mg/dL (calc) (ref <130)
Total CHOL/HDL Ratio: 1.9 (calc) (ref <5.0)
Triglycerides: 76 mg/dL (ref <150)

## 2020-10-12 LAB — URINALYSIS W MICROSCOPIC + REFLEX CULTURE
Bacteria, UA: NONE SEEN /HPF
Bilirubin Urine: NEGATIVE
Glucose, UA: NEGATIVE
Hgb urine dipstick: NEGATIVE
Hyaline Cast: NONE SEEN /LPF
Ketones, ur: NEGATIVE
Nitrites, Initial: NEGATIVE
Protein, ur: NEGATIVE
Specific Gravity, Urine: 1.016 (ref 1.001–1.035)
Squamous Epithelial / HPF: NONE SEEN /HPF (ref <=5)
pH: 7 (ref 5.0–8.0)

## 2020-10-12 LAB — CULTURE INDICATED

## 2020-10-12 LAB — THYROID PANEL WITH TSH
Free Thyroxine Index: 2.7 (ref 1.4–3.8)
T3 Uptake: 31 % (ref 22–35)
T4, Total: 8.8 ug/dL (ref 5.1–11.9)
TSH: 1.2 mIU/L (ref 0.40–4.50)

## 2020-10-12 LAB — CBC WITH DIFFERENTIAL/PLATELET
Absolute Monocytes: 790 cells/uL (ref 200–950)
Basophils Absolute: 71 cells/uL (ref 0–200)
Basophils Relative: 0.9 %
Eosinophils Absolute: 308 cells/uL (ref 15–500)
Eosinophils Relative: 3.9 %
HCT: 41.7 % (ref 35.0–45.0)
Hemoglobin: 14.1 g/dL (ref 11.7–15.5)
Lymphs Abs: 1335 cells/uL (ref 850–3900)
MCH: 30.5 pg (ref 27.0–33.0)
MCHC: 33.8 g/dL (ref 32.0–36.0)
MCV: 90.1 fL (ref 80.0–100.0)
MPV: 9 fL (ref 7.5–12.5)
Monocytes Relative: 10 %
Neutro Abs: 5396 cells/uL (ref 1500–7800)
Neutrophils Relative %: 68.3 %
Platelets: 263 10*3/uL (ref 140–400)
RBC: 4.63 10*6/uL (ref 3.80–5.10)
RDW: 13.4 % (ref 11.0–15.0)
Total Lymphocyte: 16.9 %
WBC: 7.9 10*3/uL (ref 3.8–10.8)

## 2020-10-12 LAB — COMPREHENSIVE METABOLIC PANEL
AG Ratio: 1.4 (calc) (ref 1.0–2.5)
ALT: 20 U/L (ref 6–29)
AST: 24 U/L (ref 10–35)
Albumin: 4.6 g/dL (ref 3.6–5.1)
Alkaline phosphatase (APISO): 64 U/L (ref 37–153)
BUN/Creatinine Ratio: 17 (calc) (ref 6–22)
BUN: 17 mg/dL (ref 7–25)
CO2: 29 mmol/L (ref 20–32)
Calcium: 9.9 mg/dL (ref 8.6–10.4)
Chloride: 96 mmol/L — ABNORMAL LOW (ref 98–110)
Creat: 1.01 mg/dL — ABNORMAL HIGH (ref 0.60–0.88)
Globulin: 3.2 g/dL (calc) (ref 1.9–3.7)
Glucose, Bld: 85 mg/dL (ref 65–99)
Potassium: 4 mmol/L (ref 3.5–5.3)
Sodium: 135 mmol/L (ref 135–146)
Total Bilirubin: 0.7 mg/dL (ref 0.2–1.2)
Total Protein: 7.8 g/dL (ref 6.1–8.1)

## 2020-10-12 LAB — URINE CULTURE
MICRO NUMBER:: 12030716
SPECIMEN QUALITY:: ADEQUATE

## 2020-10-13 ENCOUNTER — Telehealth: Payer: Self-pay | Admitting: Family Medicine

## 2020-10-13 MED ORDER — NITROFURANTOIN MONOHYD MACRO 100 MG PO CAPS: 100.0000 mg | ORAL_CAPSULE | Freq: Two times a day (BID) | ORAL | 0 refills | Status: DC

## 2020-10-13 NOTE — Telephone Encounter (Signed)
Labs show:  Urine looks infected.  Will call in macrobid.  Kidney function is stable.  All else looks good.

## 2020-10-16 NOTE — Telephone Encounter (Signed)
noted 

## 2020-10-20 ENCOUNTER — Telehealth: Payer: Self-pay | Admitting: Family Medicine

## 2020-10-20 NOTE — Telephone Encounter (Signed)
Patient called advised she still have a cough and her urine is a bright bright yellow. Patient asked if the medication can turn her urine bright yellow? The number to contact patient is [phone]

## 2020-10-20 NOTE — Telephone Encounter (Signed)
Regarding the cough, should she give the prednisone taper longer to work?

## 2020-10-20 NOTE — Telephone Encounter (Signed)
I called and advised the patient. 

## 2020-10-20 NOTE — Telephone Encounter (Signed)
Please advise. Currently taking Macrobid.

## 2020-11-02 ENCOUNTER — Telehealth: Payer: Self-pay | Admitting: Family Medicine

## 2020-11-02 DIAGNOSIS — R059 Cough, unspecified: Secondary | ICD-10-CM

## 2020-11-02 NOTE — Telephone Encounter (Signed)
Patient called. Would like Terri to call her. Her call back number is [phone]

## 2020-11-02 NOTE — Telephone Encounter (Signed)
I called: the patient continues to be plagued by a cough. It is productive of thick, yellowish phlegm -- not as dark as it was before. She says she felt better while on the last prednisone pack -- she asks if she could try another round of this.

## 2020-11-03 MED ORDER — PREDNISONE 10 MG PO TABS: ORAL_TABLET | ORAL | 0 refills | Status: DC

## 2020-11-03 NOTE — Telephone Encounter (Signed)
I called and advised the patient she may walk in for the chest xray. Dr. Prince Rome will advise her of the results (MyChart message or phone call).

## 2020-11-03 NOTE — Addendum Note (Signed)
Addended by: Lillia Carmel on: 11/03/2020 01:06 PM   Modules accepted: Orders

## 2020-11-03 NOTE — Telephone Encounter (Signed)
I called the patient and advised her of the new Rx for prednisone taper. She has been scheduled for a pulmonary consult in November -- she will call them back to get on a wait list.  The patient is asking if she should have a chest xray. Please advise.

## 2020-11-10 ENCOUNTER — Telehealth: Payer: Self-pay | Admitting: Family Medicine

## 2020-11-10 ENCOUNTER — Ambulatory Visit
Admission: RE | Admit: 2020-11-10 | Discharge: 2020-11-10 | Disposition: A | Discharge status: Home / Self Care | Payer: Medicare Other | Source: Ambulatory Visit | Attending: Family Medicine | Admitting: Family Medicine

## 2020-11-10 DIAGNOSIS — R059 Cough, unspecified: Secondary | ICD-10-CM

## 2020-11-10 NOTE — Telephone Encounter (Signed)
I called and spoke with [name], he states that she seems to be dazed and confused, she can't make any decisions on her on. He states that this seemed to have started at the same time as she started the pred pak. I advised that she can stop it and that if he needs anything that he can take her to the urgent care to be evaluated, and he will call us on Monday to advise on how she is doing at that time, but he will keep her monitored over the weekend.

## 2020-11-10 NOTE — Telephone Encounter (Signed)
Pt's brother called requesting a call back. He states pt is having a medication reaction to steroids. Please call Todd at [phone].

## 2020-11-13 ENCOUNTER — Telehealth: Payer: Self-pay | Admitting: Family Medicine

## 2020-11-13 NOTE — Telephone Encounter (Signed)
Pt called stating she looked at her chest x-rays and would like a CB to discuss it further.   [phone]

## 2020-11-13 NOTE — Telephone Encounter (Signed)
I called and left this message on the patient's voice mail. Advised her to call me back with any questions/concerns.

## 2020-11-13 NOTE — Telephone Encounter (Signed)
I called and left the chest xray results message from Dr. Prince Rome (see other message on this) on the voice mail. Advised her to call back with any further questions/concerns.

## 2020-11-13 NOTE — Telephone Encounter (Signed)
Chest x-ray shows clear lungs.

## 2020-11-28 ENCOUNTER — Other Ambulatory Visit: Payer: Self-pay | Admitting: Family Medicine

## 2020-11-28 ENCOUNTER — Telehealth: Payer: Self-pay

## 2020-11-28 DIAGNOSIS — R059 Cough, unspecified: Secondary | ICD-10-CM

## 2020-11-28 NOTE — Telephone Encounter (Signed)
The message below...are we waiting for input or is someone going to call her with the input from the pulmonary specialist?  Notify pt:   Chest x-ray shows clear lungs.   We'll await input from pulmonary specialist.

## 2020-12-05 ENCOUNTER — Telehealth: Payer: Self-pay | Admitting: Pulmonary Disease

## 2020-12-05 NOTE — Telephone Encounter (Signed)
Call made to patient, confirmed DOB. Patient reports she had to put her cat down due to a brain tumor, at that time she got really sick due to grief back in march. Patient is very tearful still. Following this she developed a productive cough with thick yellow mucous and increased SOB worse than her baseline. She has been using her Breo daily and albuterol rarely. She reports her pcp has given her prednisone and antibiotics twice to  help with cough. Patient states when she fell into her depression after the death of her cat she just started falling apart.   I made the patient aware she will need a OV as she has not been seen since November 2021.   Appt made, declined sooner appt.   Nothing further needed at this time.

## 2020-12-14 ENCOUNTER — Ambulatory Visit: Payer: Medicare Other | Admitting: Cardiology

## 2020-12-18 ENCOUNTER — Telehealth: Payer: Self-pay | Admitting: Family Medicine

## 2020-12-18 NOTE — Telephone Encounter (Signed)
I called and reached the patient's voice mail. I did leave a message advising her that Dr. Prince Rome' last day with this office was 12/15/20. Asked her to give me a call back.

## 2020-12-18 NOTE — Telephone Encounter (Signed)
Pt called asking for a CB from Terri to discuss what she should do after her pulmonary appt. Pt states she will have the appt on 9/19/22for the breathing test.   [phone]

## 2020-12-19 NOTE — Telephone Encounter (Signed)
Left another voice message to call me back.

## 2020-12-21 NOTE — Telephone Encounter (Signed)
The patient called back. She does have another primary care doctor now, who will be able to follow up with her after she sees the pulmonologist later this month.

## 2021-01-08 ENCOUNTER — Encounter: Payer: Self-pay | Admitting: Pulmonary Disease

## 2021-01-08 ENCOUNTER — Ambulatory Visit (INDEPENDENT_AMBULATORY_CARE_PROVIDER_SITE_OTHER): Payer: Medicare Other | Admitting: Pulmonary Disease

## 2021-01-08 ENCOUNTER — Other Ambulatory Visit: Payer: Self-pay

## 2021-01-08 VITALS — BP 118/70 | HR 67 | Ht <= 58 in | Wt 102.0 lb

## 2021-01-08 DIAGNOSIS — J449 Chronic obstructive pulmonary disease, unspecified: Secondary | ICD-10-CM | POA: Diagnosis not present

## 2021-01-08 DIAGNOSIS — I259 Chronic ischemic heart disease, unspecified: Secondary | ICD-10-CM

## 2021-01-08 MED ORDER — TRELEGY ELLIPTA 100-62.5-25 MCG/INH IN AEPB: 1.0000 | INHALATION_SPRAY | Freq: Every day | RESPIRATORY_TRACT | 0 refills | Status: DC

## 2021-01-08 NOTE — Patient Instructions (Signed)
Thank you for visiting Dr. Tonia Brooms at [facility]. Today we recommend the following:  Trelegy  Continue albuterol as needed   Return in about 1 year (around 01/08/2022) for with APP or Dr. Tonia Brooms.    Please do your part to reduce the spread of COVID-19.

## 2021-01-08 NOTE — Progress Notes (Signed)
re  Synopsis: Referred in nov 2021 for COPD by Lavada Mesi, MD  Subjective:   PATIENT ID: Kiara Jones GENDER: female DOB: March 20, 1936, MRN: [id number]  Chief Complaint  Patient presents with   Follow-up    F/U for increased SOB and productive cough with yellow phlegm. States this has been going on since April.     PMH of COPD, HTN, HLD, MVP. She has had COPD for a long time. She currently takes albuterol and BREO. Former smoker, she quit smoking 15 years ago, smoking started at age 39 yo, off and on through the years. She moved from [place] and relocated here. She was initially seen here at [facility]. Currently the patient is doing well.  She has no respiratory complaints today.  She is compliant with her current inhalers.  She needs to establish care with a new primary pulmonologist.  She is to follow-up with Dr. Jamse Mead in [facility].  We will request records.  OV 01/08/2018: 85 year old female here today for follow-up COPD.  Currently doing well with her current COPD regimen.  Has no respiratory complaints.  Her cough is stable.  Shortness of breath is stable.  She is using her inhalers.  Continue her current Breo inhaler.   Past Medical History:  Diagnosis Date   Chronic kidney disease (CKD), active medical management without dialysis, stage 3 (moderate) (HCC)    COPD (chronic obstructive pulmonary disease) (HCC)    Hyperlipidemia    Hypertension    Hypothyroid    Mitral valve prolapse      Family History  Problem Relation Age of Onset   Breast cancer Mother    CAD Father      Past Surgical History:  Procedure Laterality Date   ABDOMINAL HYSTERECTOMY     APPENDECTOMY     BACK SURGERY     BRAIN TUMOR EXCISION     CHOLECYSTECTOMY      Social History   Socioeconomic History   Marital status: Widowed    Spouse name: Not on file   Number of children: Not on file   Years of education: Not on file   Highest education level: Not on file  Occupational  History   Not on file  Tobacco Use   Smoking status: Former    Types: Cigarettes, E-cigarettes   Smokeless tobacco: Former  Substance and Sexual Activity   Alcohol use: Never   Drug use: Never   Sexual activity: Not on file  Other Topics Concern   Not on file  Social History Narrative   Not on file   Social Determinants of Health   Financial Resource Strain: Not on file  Food Insecurity: Not on file  Transportation Needs: Not on file  Physical Activity: Not on file  Stress: Not on file  Social Connections: Not on file  Intimate Partner Violence: Not on file     Allergies  Allergen Reactions   Gabapentin Shortness Of Breath and Other (See Comments)    Fatigue, blurred vision   Lyrica [Pregabalin] Shortness Of Breath and Other (See Comments)    Blurred vision, fatigue   Alprazolam Other (See Comments)    Sick to stomach, tremors inside & out, loss of appetite   Aspirin Other (See Comments)   Codeine Other (See Comments)    Heartburn, ulcer upset indigestion   Nsaids Other (See Comments)    "upsets my ulcer"    Nutritional Supplements Other (See Comments)   Penicillins Other (See Comments)    Cold  sweat, almost passed out   Sertraline Other (See Comments)    Sick to stomach, tremors inside & out, loss of appetite   Tape    Tapentadol Other (See Comments)   Silicone Rash    Silicone-based pain patches - caused a rash   Sulfa Antibiotics Rash     Outpatient Medications Prior to Visit  Medication Sig Dispense Refill   albuterol (VENTOLIN HFA) 108 (90 Base) MCG/ACT inhaler      amLODipine (NORVASC) 2.5 MG tablet Take 1 tablet (2.5 mg total) by mouth 2 (two) times daily. 180 tablet 2   benzonatate (TESSALON PERLES) 100 MG capsule Take 1-2 capsules (100-200 mg total) by mouth 3 (three) times daily as needed for cough. 60 capsule 1   BREO ELLIPTA 200-25 MCG/INH AEPB Inhale 1 puff into the lungs daily.     busPIRone (BUSPAR) 15 MG tablet TAKE ONE TABLET BY MOUTH TWICE A  DAY 180 tablet 1   Carbidopa-Levodopa ER (SINEMET CR) 25-100 MG tablet controlled release Take 2 tablets by mouth daily. 180 tablet 1   chlorpheniramine-HYDROcodone (TUSSIONEX PENNKINETIC ER) 10-8 MG/5ML SUER Take 5 mLs by mouth every 12 (twelve) hours as needed for cough. 115 mL 0   diclofenac Sodium (VOLTAREN) 1 % GEL Apply 4 g topically 4 (four) times daily as needed. 500 g 6   escitalopram (LEXAPRO) 5 MG tablet Take 0.5 tablets (2.5 mg total) by mouth daily. 30 tablet 3   gabapentin (NEURONTIN) 100 MG capsule Take 100 mg by mouth 3 (three) times daily.     hydrochlorothiazide (HYDRODIURIL) 12.5 MG tablet Take 1 tablet (12.5 mg total) by mouth daily. 90 tablet 2   HYDROcodone bit-homatropine (HYCODAN) 5-1.5 MG/5ML syrup Take 5 mLs by mouth every 6 (six) hours as needed for cough. 120 mL 0   levothyroxine (SYNTHROID) 75 MCG tablet Take 1 tablet (75 mcg total) by mouth daily before breakfast. 90 tablet 2   losartan (COZAAR) 100 MG tablet Take 1 tablet (100 mg total) by mouth daily. 90 tablet 2   morphine (MS CONTIN) 30 MG 12 hr tablet Take 30 mg by mouth 2 (two) times daily.     oxyCODONE-acetaminophen (PERCOCET) 10-325 MG tablet Take 1 tablet by mouth every 4 (four) hours as needed.     pravastatin (PRAVACHOL) 80 MG tablet Take 1 tablet (80 mg total) by mouth daily. 90 tablet 2   temazepam (RESTORIL) 30 MG capsule Take 1 capsule (30 mg total) by mouth at bedtime as needed for sleep. 90 capsule 0   azithromycin (ZITHROMAX) 250 MG tablet 2 PO x 1 day, then 1 PO qd x 4 more days. 6 each 0   nitrofurantoin, macrocrystal-monohydrate, (MACROBID) 100 MG capsule Take 1 capsule (100 mg total) by mouth 2 (two) times daily. 14 capsule 0   predniSONE (DELTASONE) 10 MG tablet Take as directed for 12 days.  Daily dose 6,6,5,5,4,4,3,3,2,2,1,1. 42 tablet 0   predniSONE (DELTASONE) 10 MG tablet Take as directed for 12 days.  Daily dose 6,6,5,5,4,4,3,3,2,2,1,1. 42 tablet 0   No facility-administered medications  prior to visit.    Review of Systems  Constitutional:  Negative for chills, fever, malaise/fatigue and weight loss.  HENT:  Negative for hearing loss, sore throat and tinnitus.   Eyes:  Negative for blurred vision and double vision.  Respiratory:  Positive for cough and shortness of breath. Negative for hemoptysis, sputum production, wheezing and stridor.   Cardiovascular:  Negative for chest pain, palpitations, orthopnea, leg swelling and PND.  Gastrointestinal:  Negative for abdominal pain, constipation, diarrhea, heartburn, nausea and vomiting.  Genitourinary:  Negative for dysuria, hematuria and urgency.  Musculoskeletal:  Negative for joint pain and myalgias.  Skin:  Negative for itching and rash.  Neurological:  Negative for dizziness, tingling, weakness and headaches.  Endo/Heme/Allergies:  Negative for environmental allergies. Does not bruise/bleed easily.  Psychiatric/Behavioral:  Negative for depression. The patient is not nervous/anxious and does not have insomnia.   All other systems reviewed and are negative.   Objective:  Physical Exam Vitals reviewed.  Constitutional:      General: She is not in acute distress.    Appearance: She is well-developed.  HENT:     Head: Normocephalic and atraumatic.     Mouth/Throat:     Pharynx: No oropharyngeal exudate.  Eyes:     Conjunctiva/sclera: Conjunctivae normal.     Pupils: Pupils are equal, round, and reactive to light.  Neck:     Vascular: No JVD.     Trachea: No tracheal deviation.     Comments: Loss of supraclavicular fat Cardiovascular:     Rate and Rhythm: Normal rate and regular rhythm.     Heart sounds: S1 normal and S2 normal.     Comments: Distant heart tones Pulmonary:     Effort: No tachypnea or accessory muscle usage.     Breath sounds: No stridor. Decreased breath sounds (throughout all lung fields) present. No wheezing, rhonchi or rales.  Abdominal:     General: Bowel sounds are normal. There is no  distension.     Palpations: Abdomen is soft.     Tenderness: There is no abdominal tenderness.  Musculoskeletal:        General: No deformity (muscle wasting ).  Skin:    General: Skin is warm and dry.     Capillary Refill: Capillary refill takes less than 2 seconds.     Findings: No rash.  Neurological:     Mental Status: She is alert and oriented to person, place, and time.  Psychiatric:        Behavior: Behavior normal.     Vitals:   01/08/21 1424  BP: 118/70  Pulse: 67  SpO2: 96%  Weight: 102 lb (46.3 kg)  Height: 4\' 9"  (1.448 m)   96% on RA BMI Readings from Last 3 Encounters:  01/08/21 22.07 kg/m  10/09/20 22.38 kg/m  04/12/20 23.02 kg/m   Wt Readings from Last 3 Encounters:  01/08/21 102 lb (46.3 kg)  10/09/20 103 lb 6.4 oz (46.9 kg)  04/12/20 106 lb 6.4 oz (48.3 kg)     CBC    Component Value Date/Time   WBC 7.9 10/09/2020 1435   RBC 4.63 10/09/2020 1435   HGB 14.1 10/09/2020 1435   HCT 41.7 10/09/2020 1435   PLT 263 10/09/2020 1435   MCV 90.1 10/09/2020 1435   MCH 30.5 10/09/2020 1435   MCHC 33.8 10/09/2020 1435   RDW 13.4 10/09/2020 1435   LYMPHSABS 1,335 10/09/2020 1435   EOSABS 308 10/09/2020 1435   BASOSABS 71 10/09/2020 1435     Chest Imaging: No chest imaging for review at this time.  Pulmonary Functions Testing Results: No flowsheet data found.  FeNO:   Pathology:   Echocardiogram:   Heart Catheterization:     Assessment & Plan:     ICD-10-CM   1. Chronic obstructive pulmonary disease, unspecified COPD type (HCC)  J44.9     2. Chronic ischemic heart disease  I25.9  Discussion: This is an 85 year old female, history of smoking quit 15 years ago smoked for 50+ years.  Next pack was 1/day.  Diagnosed with COPD currently managed on Breo 200 for a long time.  No recent exacerbations or hospitalization.  Plan: Step therapy up to Trelegy. Started on triple therapy and see how she does. Continue albuterol as needed  for shortness of breath and wheezing. Follow up in 1 year or as needed     Current Outpatient Medications:    albuterol (VENTOLIN HFA) 108 (90 Base) MCG/ACT inhaler, , Disp: , Rfl:    amLODipine (NORVASC) 2.5 MG tablet, Take 1 tablet (2.5 mg total) by mouth 2 (two) times daily., Disp: 180 tablet, Rfl: 2   benzonatate (TESSALON PERLES) 100 MG capsule, Take 1-2 capsules (100-200 mg total) by mouth 3 (three) times daily as needed for cough., Disp: 60 capsule, Rfl: 1   BREO ELLIPTA 200-25 MCG/INH AEPB, Inhale 1 puff into the lungs daily., Disp: , Rfl:    busPIRone (BUSPAR) 15 MG tablet, TAKE ONE TABLET BY MOUTH TWICE A DAY, Disp: 180 tablet, Rfl: 1   Carbidopa-Levodopa ER (SINEMET CR) 25-100 MG tablet controlled release, Take 2 tablets by mouth daily., Disp: 180 tablet, Rfl: 1   chlorpheniramine-HYDROcodone (TUSSIONEX PENNKINETIC ER) 10-8 MG/5ML SUER, Take 5 mLs by mouth every 12 (twelve) hours as needed for cough., Disp: 115 mL, Rfl: 0   diclofenac Sodium (VOLTAREN) 1 % GEL, Apply 4 g topically 4 (four) times daily as needed., Disp: 500 g, Rfl: 6   escitalopram (LEXAPRO) 5 MG tablet, Take 0.5 tablets (2.5 mg total) by mouth daily., Disp: 30 tablet, Rfl: 3   gabapentin (NEURONTIN) 100 MG capsule, Take 100 mg by mouth 3 (three) times daily., Disp: , Rfl:    hydrochlorothiazide (HYDRODIURIL) 12.5 MG tablet, Take 1 tablet (12.5 mg total) by mouth daily., Disp: 90 tablet, Rfl: 2   HYDROcodone bit-homatropine (HYCODAN) 5-1.5 MG/5ML syrup, Take 5 mLs by mouth every 6 (six) hours as needed for cough., Disp: 120 mL, Rfl: 0   levothyroxine (SYNTHROID) 75 MCG tablet, Take 1 tablet (75 mcg total) by mouth daily before breakfast., Disp: 90 tablet, Rfl: 2   losartan (COZAAR) 100 MG tablet, Take 1 tablet (100 mg total) by mouth daily., Disp: 90 tablet, Rfl: 2   morphine (MS CONTIN) 30 MG 12 hr tablet, Take 30 mg by mouth 2 (two) times daily., Disp: , Rfl:    oxyCODONE-acetaminophen (PERCOCET) 10-325 MG tablet,  Take 1 tablet by mouth every 4 (four) hours as needed., Disp: , Rfl:    pravastatin (PRAVACHOL) 80 MG tablet, Take 1 tablet (80 mg total) by mouth daily., Disp: 90 tablet, Rfl: 2   temazepam (RESTORIL) 30 MG capsule, Take 1 capsule (30 mg total) by mouth at bedtime as needed for sleep., Disp: 90 capsule, Rfl: 0   Josephine Igo, DO [facility] 01/08/2021 2:53 PM

## 2021-01-19 ENCOUNTER — Ambulatory Visit: Payer: Medicare Other | Admitting: Cardiology

## 2021-01-22 ENCOUNTER — Encounter (HOSPITAL_COMMUNITY): Payer: Self-pay | Admitting: Radiology

## 2021-01-22 ENCOUNTER — Emergency Department (HOSPITAL_COMMUNITY): Payer: Medicare Other

## 2021-01-22 ENCOUNTER — Emergency Department (HOSPITAL_COMMUNITY)
Admission: EM | Admit: 2021-01-22 | Discharge: 2021-01-22 | Disposition: A | Discharge status: Home / Self Care | Payer: Medicare Other | Attending: Emergency Medicine | Admitting: Emergency Medicine

## 2021-01-22 DIAGNOSIS — J449 Chronic obstructive pulmonary disease, unspecified: Secondary | ICD-10-CM | POA: Insufficient documentation

## 2021-01-22 DIAGNOSIS — W010XXA Fall on same level from slipping, tripping and stumbling without subsequent striking against object, initial encounter: Secondary | ICD-10-CM | POA: Insufficient documentation

## 2021-01-22 DIAGNOSIS — Y9301 Activity, walking, marching and hiking: Secondary | ICD-10-CM | POA: Diagnosis not present

## 2021-01-22 DIAGNOSIS — R519 Headache, unspecified: Secondary | ICD-10-CM | POA: Insufficient documentation

## 2021-01-22 DIAGNOSIS — I129 Hypertensive chronic kidney disease with stage 1 through stage 4 chronic kidney disease, or unspecified chronic kidney disease: Secondary | ICD-10-CM | POA: Diagnosis not present

## 2021-01-22 DIAGNOSIS — S6992XA Unspecified injury of left wrist, hand and finger(s), initial encounter: Secondary | ICD-10-CM | POA: Diagnosis present

## 2021-01-22 DIAGNOSIS — N183 Chronic kidney disease, stage 3 unspecified: Secondary | ICD-10-CM | POA: Insufficient documentation

## 2021-01-22 DIAGNOSIS — Z7951 Long term (current) use of inhaled steroids: Secondary | ICD-10-CM | POA: Insufficient documentation

## 2021-01-22 DIAGNOSIS — S62102A Fracture of unspecified carpal bone, left wrist, initial encounter for closed fracture: Secondary | ICD-10-CM | POA: Insufficient documentation

## 2021-01-22 DIAGNOSIS — Z87891 Personal history of nicotine dependence: Secondary | ICD-10-CM | POA: Diagnosis not present

## 2021-01-22 DIAGNOSIS — Y9289 Other specified places as the place of occurrence of the external cause: Secondary | ICD-10-CM | POA: Diagnosis not present

## 2021-01-22 DIAGNOSIS — Z79899 Other long term (current) drug therapy: Secondary | ICD-10-CM | POA: Diagnosis not present

## 2021-01-22 DIAGNOSIS — E039 Hypothyroidism, unspecified: Secondary | ICD-10-CM | POA: Insufficient documentation

## 2021-01-22 MED ORDER — MORPHINE SULFATE (PF) 4 MG/ML IV SOLN
4.0000 mg | Freq: Once | INTRAVENOUS | Status: AC
  Administered 2021-01-22: 4 mg via INTRAVENOUS
  Filled 2021-01-22: qty 1

## 2021-01-22 NOTE — Progress Notes (Signed)
Orthopedic Tech Progress Note Patient Details:  Kiara Jones 11-09-1935 [id number]  Ortho Devices Type of Ortho Device: Ace wrap, Arm sling, Sugartong splint Ortho Device/Splint Location: left reduction Ortho Device/Splint Interventions: Application   Post Interventions Patient Tolerated: Well Instructions Provided: Care of device  Saul Fordyce 01/22/2021, 9:34 AM

## 2021-01-22 NOTE — ED Provider Notes (Signed)
[facility]-EMERGENCY DEPT Provider Note   CSN: [id number] Arrival date & time: 01/22/21  7425     History No chief complaint on file.   Kiara Jones is a 85 y.o. female.  85 year old female with prior medical history as detailed below presents for evaluation.  She reports that she fell this morning while attempting to go to the bathroom.  She landed hard on her left wrist.  She complains of pain and deformity to the left wrist.  She has a hematoma on her chin.  She complains of mild pain to the chin.  She denies neck pain.  She denies LOC.  She complains of mild pain to the left lateral ribs.  She was ambulatory after the fall.  The history is provided by the patient.  Wrist Pain This is a new problem. The current episode started 1 to 2 hours ago. The problem occurs rarely. The problem has not changed since onset.Pertinent negatives include no chest pain, no abdominal pain and no headaches. Nothing aggravates the symptoms. Nothing relieves the symptoms.        Past Medical History:  Diagnosis Date   Chronic kidney disease (CKD), active medical management without dialysis, stage 3 (moderate) (HCC)    COPD (chronic obstructive pulmonary disease) (HCC)    Hyperlipidemia    Hypertension    Hypothyroid    Mitral valve prolapse     Patient Active Problem List   Diagnosis Date Noted   Hypertension 10/09/2020   Hyperlipidemia 10/12/2019   Allergic rhinitis 04/13/2019   Chronic obstructive pulmonary disease (HCC) 04/13/2019   Hypothyroidism 04/13/2019   Osteoporosis 04/13/2019   RLS (restless legs syndrome) 04/13/2019   Chronic ischemic heart disease 04/13/2019   Spondylosis 04/13/2019   Colitis 04/13/2019   Spinal stenosis of lumbar region 04/13/2019   Peripheral neuropathy 04/13/2019   Lymphedema of leg 04/13/2019    Past Surgical History:  Procedure Laterality Date   ABDOMINAL HYSTERECTOMY     APPENDECTOMY     BACK SURGERY     BRAIN TUMOR  EXCISION     CHOLECYSTECTOMY       OB History   No obstetric history on file.     Family History  Problem Relation Age of Onset   Breast cancer Mother    CAD Father     Social History   Tobacco Use   Smoking status: Former    Types: Cigarettes, E-cigarettes   Smokeless tobacco: Former  Substance Use Topics   Alcohol use: Never   Drug use: Never    Home Medications Prior to Admission medications   Medication Sig Start Date End Date Taking? Authorizing Provider  albuterol (VENTOLIN HFA) 108 (90 Base) MCG/ACT inhaler  09/04/19   [provider]  amLODipine (NORVASC) 2.5 MG tablet Take 1 tablet (2.5 mg total) by mouth 2 (two) times daily. 10/09/20   Hilts, Casimiro Needle, MD  benzonatate (TESSALON PERLES) 100 MG capsule Take 1-2 capsules (100-200 mg total) by mouth 3 (three) times daily as needed for cough. 10/09/20   Hilts, Casimiro Needle, MD  BREO ELLIPTA 200-25 MCG/INH AEPB Inhale 1 puff into the lungs daily. 09/23/20   [provider]  busPIRone (BUSPAR) 15 MG tablet TAKE ONE TABLET BY MOUTH TWICE A DAY 07/24/20   Hilts, Casimiro Needle, MD  Carbidopa-Levodopa ER (SINEMET CR) 25-100 MG tablet controlled release Take 2 tablets by mouth daily. 12/28/19   Hilts, Casimiro Needle, MD  chlorpheniramine-HYDROcodone (TUSSIONEX PENNKINETIC ER) 10-8 MG/5ML SUER Take 5 mLs by mouth every  12 (twelve) hours as needed for cough. 05/10/20   Hilts, Casimiro Needle, MD  diclofenac Sodium (VOLTAREN) 1 % GEL Apply 4 g topically 4 (four) times daily as needed. 04/12/20   Hilts, Casimiro Needle, MD  escitalopram (LEXAPRO) 5 MG tablet Take 0.5 tablets (2.5 mg total) by mouth daily. 08/08/20   Hilts, Casimiro Needle, MD  Fluticasone-Umeclidin-Vilant (TRELEGY ELLIPTA) 100-62.5-25 MCG/INH AEPB Inhale 1 puff into the lungs daily. 01/08/21   Icard, Rachel Bo, DO  gabapentin (NEURONTIN) 100 MG capsule Take 100 mg by mouth 3 (three) times daily. 09/11/20   [provider]  hydrochlorothiazide (HYDRODIURIL) 12.5 MG tablet Take 1 tablet (12.5 mg  total) by mouth daily. 10/09/20   Hilts, Casimiro Needle, MD  HYDROcodone bit-homatropine (HYCODAN) 5-1.5 MG/5ML syrup Take 5 mLs by mouth every 6 (six) hours as needed for cough. 08/18/20   Hilts, Casimiro Needle, MD  levothyroxine (SYNTHROID) 75 MCG tablet Take 1 tablet (75 mcg total) by mouth daily before breakfast. 10/09/20   Hilts, Casimiro Needle, MD  losartan (COZAAR) 100 MG tablet Take 1 tablet (100 mg total) by mouth daily. 10/09/20   Hilts, Casimiro Needle, MD  morphine (MS CONTIN) 30 MG 12 hr tablet Take 30 mg by mouth 2 (two) times daily. 04/02/19   [provider]  oxyCODONE-acetaminophen (PERCOCET) 10-325 MG tablet Take 1 tablet by mouth every 4 (four) hours as needed. 04/02/19   [provider]  pravastatin (PRAVACHOL) 80 MG tablet Take 1 tablet (80 mg total) by mouth daily. 10/09/20   Hilts, Casimiro Needle, MD  temazepam (RESTORIL) 30 MG capsule Take 1 capsule (30 mg total) by mouth at bedtime as needed for sleep. 04/13/19   Hilts, Casimiro Needle, MD    Allergies    Gabapentin, Lyrica [pregabalin], Alprazolam, Aspirin, Codeine, Nsaids, Nutritional supplements, Penicillins, Sertraline, Tape, Tapentadol, Silicone, and Sulfa antibiotics  Review of Systems   Review of Systems  Cardiovascular:  Negative for chest pain.  Gastrointestinal:  Negative for abdominal pain.  Neurological:  Negative for headaches.  All other systems reviewed and are negative.  Physical Exam Updated Vital Signs BP (!) 142/66   Pulse 67   Temp 97.6 F (36.4 C) (Oral) Comment: Simultaneous filing. User may not have seen previous data. Comment (Src): Simultaneous filing. User may not have seen previous data.  Resp 18   Ht 4\' 9"  (1.448 m)   Wt 45.4 kg   SpO2 92%   BMI 21.64 kg/m   Physical Exam Vitals and nursing note reviewed.  Constitutional:      General: She is not in acute distress.    Appearance: Normal appearance. She is well-developed.  HENT:     Head: Normocephalic and atraumatic.  Eyes:     Conjunctiva/sclera:  Conjunctivae normal.     Pupils: Pupils are equal, round, and reactive to light.  Cardiovascular:     Rate and Rhythm: Normal rate and regular rhythm.     Heart sounds: Normal heart sounds.  Pulmonary:     Effort: Pulmonary effort is normal. No respiratory distress.     Breath sounds: Normal breath sounds.  Abdominal:     General: There is no distension.     Palpations: Abdomen is soft.     Tenderness: There is no abdominal tenderness.  Musculoskeletal:        General: Tenderness and deformity present. Normal range of motion.     Cervical back: Normal range of motion and neck supple.     Comments: Tenderness and deformity noted to the distal left forearm.  Distal left  upper extremity is neurovascular intact.  Skin:    General: Skin is warm and dry.     Comments: Contusion noted to left chin.  Superficial skin tears noted to right hand.  Neurological:     General: No focal deficit present.     Mental Status: She is alert and oriented to person, place, and time.    ED Results / Procedures / Treatments   Labs (all labs ordered are listed, but only abnormal results are displayed) Labs Reviewed - No data to display  EKG None  Radiology DG Ribs Unilateral W/Chest Left  Result Date: 01/22/2021 CLINICAL DATA:  85 year old female with history of trauma from a fall complaining of left lateral rib pain. EXAM: LEFT RIBS AND CHEST - 3+ VIEW COMPARISON:  Chest x-ray 11/10/2020. FINDINGS: Lung volumes are normal. No consolidative airspace disease. No pleural effusions. No pneumothorax. No pulmonary nodule or mass noted. Pulmonary vasculature and the cardiomediastinal silhouette are within normal limits. Atherosclerosis in the thoracic aorta. Dedicated views of the left ribs demonstrate old healed fractures of the lateral left sixth and seventh ribs. No definite acute displaced left-sided rib fractures are confidently identified. IMPRESSION: 1. No acute displaced left-sided rib fractures. 2. Old  healed fractures of the lateral left sixth and seventh ribs again noted. 3. No radiographic evidence of acute cardiopulmonary disease. 4. Atherosclerosis. Electronically Signed   By: Trudie Reed M.D.   On: 01/22/2021 07:39   DG Wrist Complete Left  Result Date: 01/22/2021 CLINICAL DATA:  85 year old female with history of fall complaining of left wrist pain and deformity. EXAM: LEFT WRIST - COMPLETE 3+ VIEW COMPARISON:  No priors. FINDINGS: Bones are osteopenic. Acute impacted comminuted intra-articular fracture of the distal radius with approximately 1/3 shaft width posterior displacement and mild posterior angulation. Acute mildly comminuted intra-articular fracture of the distal ulna. Carpals appear intact, although assessment is limited by presence of osteopenia and nonstandard orientation of several views. Degenerative changes are noted in the visualized portions of the hand, compatible with osteoarthritis, most severe at the first carpometacarpal joint. Soft tissue surrounding the wrist are grossly swollen. IMPRESSION: 1. Acute comminuted intra-articular fractures of the distal radius and ulna, as above. Electronically Signed   By: Trudie Reed M.D.   On: 01/22/2021 07:36    Procedures .Ortho Injury Treatment  Date/Time: 01/22/2021 11:02 AM Performed by: Wynetta Fines, MD Authorized by: Wynetta Fines, MD   Consent:    Consent obtained:  Verbal   Consent given by:  Patient   Risks discussed:  Fracture, irreducible dislocation, nerve damage, recurrent dislocation, restricted joint movement, stiffness and vascular damage   Alternatives discussed:  No treatmentInjury location: wrist Location details: left wrist Injury type: fracture Fracture type: distal radius Pre-procedure neurovascular assessment: neurovascularly intact  Anesthesia: Local anesthesia used: no  Patient sedated: NoManipulation performed: yes Skin traction used: no Skeletal traction used: yes Reduction  successful: no X-ray confirmed reduction: yes Immobilization: splint Splint type: sugar tong Splint Applied by: ED Provider and Ortho Tech Post-procedure neurovascular assessment: post-procedure neurovascularly intact     Medications Ordered in ED Medications  morphine 4 MG/ML injection 4 mg (4 mg Intravenous Given 01/22/21 0737)    ED Course  I have reviewed the triage vital signs and the nursing notes.  Pertinent labs & imaging results that were available during my care of the patient were reviewed by me and considered in my medical decision making (see chart for details).    MDM Rules/Calculators/A&P  MDM  MSE complete  Kiara Jones was evaluated in Emergency Department on 01/22/2021 for the symptoms described in the history of present illness. She was evaluated in the context of the global COVID-19 pandemic, which necessitated consideration that the patient might be at risk for infection with the SARS-CoV-2 virus that causes COVID-19. Institutional protocols and algorithms that pertain to the evaluation of patients at risk for COVID-19 are in a state of rapid change based on information released by regulatory bodies including the CDC and federal and state organizations. These policies and algorithms were followed during the patient's care in the ED.  Patient presents after fall.  Patient with obvious left wrist fracture.  Imaging is demonstrative of distal left radius and ulna fracture.  No other significant acute traumatic injury identified on work-up.  Splint applied to left wrist without difficulty.  Patient does understand need for close outpatient follow-up with Dr. Izora Ribas ([facility]).  Patient uses narcotics chronically.  She understands safe use of her regular narcotics for this acute injury.  Importance of close follow-up is stressed.  Strict return precautions given and understood.  Final Clinical Impression(s) / ED Diagnoses Final  diagnoses:  Closed fracture of left wrist, initial encounter    Rx / DC Orders ED Discharge Orders     None        Wynetta Fines, MD 01/22/21 1103

## 2021-01-22 NOTE — Discharge Instructions (Signed)
Return for any problem.   Follow up with DR. Izora Ribas (HAND) for outpatient management of your fracture. Your injury will likely require operative repair.

## 2021-01-22 NOTE — ED Notes (Signed)
Pt O2 sats decreasing to 88-89%, 2LNC applied sats increased to 95%.

## 2021-01-22 NOTE — ED Triage Notes (Signed)
Patient brought in by EMS. Patient was walking to the bathroom, missed a step and fell. Patient has deformity to the left wrist. Patient has hematoma and laceration to the chin. Laceration to the hand. Patient takes morphine and oxycodone at night for chronic back pain. No blood thinners. No LOC.

## 2021-02-02 ENCOUNTER — Telehealth: Payer: Self-pay | Admitting: Pulmonary Disease

## 2021-02-02 NOTE — Telephone Encounter (Signed)
I have called and LM on VM for the pt to call us back.    We gave her 2 samples and if this worked well, then we can send rx to her pharmacy.

## 2021-02-08 MED ORDER — TRELEGY ELLIPTA 100-62.5-25 MCG/ACT IN AEPB
1.0000 | INHALATION_SPRAY | Freq: Every day | RESPIRATORY_TRACT | 2 refills | Status: AC
Start: 2021-02-08 — End: ?

## 2021-02-08 NOTE — Telephone Encounter (Signed)
Patient would like RX for Trelegy. States is working. Pharmacy is [facility]. Patient phone number is [phone].

## 2021-02-08 NOTE — Telephone Encounter (Signed)
Called and spoke with Patient.  Patient stated she started on Trelegy samples at last OV with Dr. Tonia Brooms to try.  Patient stated Trelegy has improved her cough.  Patient stated she would like a prescription sent to [facility].  Trelegy prescription sent to requested pharmacy.  Nothing further at this time.

## 2021-02-21 ENCOUNTER — Ambulatory Visit: Payer: Medicare Other | Admitting: Primary Care

## 2021-03-07 ENCOUNTER — Inpatient Hospital Stay (HOSPITAL_COMMUNITY)
Admission: EM | Admit: 2021-03-07 | Discharge: 2021-03-14 | DRG: 190 | Disposition: A | Discharge status: Skilled Nursing Facility | Payer: Medicare Other | Source: Ambulatory Visit | Attending: Obstetrics and Gynecology | Admitting: Obstetrics and Gynecology

## 2021-03-07 ENCOUNTER — Emergency Department (HOSPITAL_COMMUNITY): Payer: Medicare Other

## 2021-03-07 ENCOUNTER — Encounter (HOSPITAL_COMMUNITY): Payer: Self-pay

## 2021-03-07 DIAGNOSIS — W19XXXD Unspecified fall, subsequent encounter: Secondary | ICD-10-CM | POA: Diagnosis present

## 2021-03-07 DIAGNOSIS — Z888 Allergy status to other drugs, medicaments and biological substances status: Secondary | ICD-10-CM

## 2021-03-07 DIAGNOSIS — Z79890 Hormone replacement therapy (postmenopausal): Secondary | ICD-10-CM

## 2021-03-07 DIAGNOSIS — G629 Polyneuropathy, unspecified: Secondary | ICD-10-CM | POA: Diagnosis present

## 2021-03-07 DIAGNOSIS — M549 Dorsalgia, unspecified: Secondary | ICD-10-CM | POA: Diagnosis present

## 2021-03-07 DIAGNOSIS — B952 Enterococcus as the cause of diseases classified elsewhere: Secondary | ICD-10-CM | POA: Diagnosis present

## 2021-03-07 DIAGNOSIS — F419 Anxiety disorder, unspecified: Secondary | ICD-10-CM | POA: Diagnosis present

## 2021-03-07 DIAGNOSIS — L8990 Pressure ulcer of unspecified site, unspecified stage: Secondary | ICD-10-CM | POA: Insufficient documentation

## 2021-03-07 DIAGNOSIS — Z8249 Family history of ischemic heart disease and other diseases of the circulatory system: Secondary | ICD-10-CM

## 2021-03-07 DIAGNOSIS — R0602 Shortness of breath: Secondary | ICD-10-CM | POA: Diagnosis not present

## 2021-03-07 DIAGNOSIS — L89151 Pressure ulcer of sacral region, stage 1: Secondary | ICD-10-CM | POA: Diagnosis present

## 2021-03-07 DIAGNOSIS — Z880 Allergy status to penicillin: Secondary | ICD-10-CM

## 2021-03-07 DIAGNOSIS — Z79899 Other long term (current) drug therapy: Secondary | ICD-10-CM

## 2021-03-07 DIAGNOSIS — M48061 Spinal stenosis, lumbar region without neurogenic claudication: Secondary | ICD-10-CM | POA: Diagnosis present

## 2021-03-07 DIAGNOSIS — S62102D Fracture of unspecified carpal bone, left wrist, subsequent encounter for fracture with routine healing: Secondary | ICD-10-CM

## 2021-03-07 DIAGNOSIS — I129 Hypertensive chronic kidney disease with stage 1 through stage 4 chronic kidney disease, or unspecified chronic kidney disease: Secondary | ICD-10-CM | POA: Diagnosis present

## 2021-03-07 DIAGNOSIS — Z803 Family history of malignant neoplasm of breast: Secondary | ICD-10-CM

## 2021-03-07 DIAGNOSIS — I341 Nonrheumatic mitral (valve) prolapse: Secondary | ICD-10-CM | POA: Diagnosis present

## 2021-03-07 DIAGNOSIS — Z882 Allergy status to sulfonamides status: Secondary | ICD-10-CM

## 2021-03-07 DIAGNOSIS — Z886 Allergy status to analgesic agent status: Secondary | ICD-10-CM

## 2021-03-07 DIAGNOSIS — Z87891 Personal history of nicotine dependence: Secondary | ICD-10-CM

## 2021-03-07 DIAGNOSIS — E785 Hyperlipidemia, unspecified: Secondary | ICD-10-CM | POA: Diagnosis not present

## 2021-03-07 DIAGNOSIS — I10 Essential (primary) hypertension: Secondary | ICD-10-CM | POA: Diagnosis present

## 2021-03-07 DIAGNOSIS — N390 Urinary tract infection, site not specified: Secondary | ICD-10-CM | POA: Diagnosis present

## 2021-03-07 DIAGNOSIS — F32A Depression, unspecified: Secondary | ICD-10-CM | POA: Diagnosis present

## 2021-03-07 DIAGNOSIS — E039 Hypothyroidism, unspecified: Secondary | ICD-10-CM | POA: Diagnosis present

## 2021-03-07 DIAGNOSIS — J9601 Acute respiratory failure with hypoxia: Secondary | ICD-10-CM

## 2021-03-07 DIAGNOSIS — Z20822 Contact with and (suspected) exposure to covid-19: Secondary | ICD-10-CM | POA: Diagnosis present

## 2021-03-07 DIAGNOSIS — Z91048 Other nonmedicinal substance allergy status: Secondary | ICD-10-CM

## 2021-03-07 DIAGNOSIS — Z885 Allergy status to narcotic agent status: Secondary | ICD-10-CM

## 2021-03-07 DIAGNOSIS — G8929 Other chronic pain: Secondary | ICD-10-CM | POA: Diagnosis present

## 2021-03-07 DIAGNOSIS — G2581 Restless legs syndrome: Secondary | ICD-10-CM | POA: Diagnosis present

## 2021-03-07 DIAGNOSIS — J441 Chronic obstructive pulmonary disease with (acute) exacerbation: Principal | ICD-10-CM | POA: Diagnosis present

## 2021-03-07 DIAGNOSIS — Z7951 Long term (current) use of inhaled steroids: Secondary | ICD-10-CM

## 2021-03-07 DIAGNOSIS — N182 Chronic kidney disease, stage 2 (mild): Secondary | ICD-10-CM | POA: Diagnosis present

## 2021-03-07 DIAGNOSIS — K5900 Constipation, unspecified: Secondary | ICD-10-CM | POA: Insufficient documentation

## 2021-03-07 LAB — BASIC METABOLIC PANEL
Anion gap: 9 (ref 5–15)
BUN: 18 mg/dL (ref 8–23)
CO2: 30 mmol/L (ref 22–32)
Calcium: 9.8 mg/dL (ref 8.9–10.3)
Chloride: 96 mmol/L — ABNORMAL LOW (ref 98–111)
Creatinine, Ser: 0.75 mg/dL (ref 0.44–1.00)
GFR, Estimated: >60 mL/min (ref >60)
Glucose, Bld: 99 mg/dL (ref 70–99)
Potassium: 3.7 mmol/L (ref 3.5–5.1)
Sodium: 135 mmol/L (ref 135–145)

## 2021-03-07 LAB — CBC WITH DIFFERENTIAL/PLATELET
Abs Immature Granulocytes: 0.03 10*3/uL (ref 0.00–0.07)
Basophils Absolute: 0.1 10*3/uL (ref 0.0–0.1)
Basophils Relative: 1 %
Eosinophils Absolute: 0.1 10*3/uL (ref 0.0–0.5)
Eosinophils Relative: 1 %
HCT: 42.3 % (ref 36.0–46.0)
Hemoglobin: 13.7 g/dL (ref 12.0–15.0)
Immature Granulocytes: 0 %
Lymphocytes Relative: 8 %
Lymphs Abs: 1 10*3/uL (ref 0.7–4.0)
MCH: 29.8 pg (ref 26.0–34.0)
MCHC: 32.4 g/dL (ref 30.0–36.0)
MCV: 92.2 fL (ref 80.0–100.0)
Monocytes Absolute: 1.3 10*3/uL — ABNORMAL HIGH (ref 0.1–1.0)
Monocytes Relative: 10 %
Neutro Abs: 10.5 10*3/uL — ABNORMAL HIGH (ref 1.7–7.7)
Neutrophils Relative %: 80 %
Platelets: 230 10*3/uL (ref 150–400)
RBC: 4.59 MIL/uL (ref 3.87–5.11)
RDW: 13.7 % (ref 11.5–15.5)
WBC: 12.9 10*3/uL — ABNORMAL HIGH (ref 4.0–10.5)
nRBC: 0 % (ref 0.0–0.2)

## 2021-03-07 LAB — RESP PANEL BY RT-PCR (FLU A&B, COVID) ARPGX2
Influenza A by PCR: NEGATIVE
Influenza B by PCR: NEGATIVE
SARS Coronavirus 2 by RT PCR: NEGATIVE

## 2021-03-07 MED ORDER — IPRATROPIUM-ALBUTEROL 0.5-2.5 (3) MG/3ML IN SOLN
3.0000 mL | Freq: Once | RESPIRATORY_TRACT | Status: AC
  Administered 2021-03-07: 3 mL via RESPIRATORY_TRACT
  Filled 2021-03-07: qty 3

## 2021-03-07 MED ORDER — ENOXAPARIN SODIUM 40 MG/0.4ML IJ SOSY
40.0000 mg | PREFILLED_SYRINGE | INTRAMUSCULAR | Status: DC
  Administered 2021-03-08 – 2021-03-13 (×7): 40 mg via SUBCUTANEOUS
  Filled 2021-03-07 (×7): qty 0.4

## 2021-03-07 MED ORDER — FLUTICASONE-UMECLIDIN-VILANT 100-62.5-25 MCG/ACT IN AEPB: 1.0000 | INHALATION_SPRAY | Freq: Every day | RESPIRATORY_TRACT | Status: DC

## 2021-03-07 MED ORDER — OXYCODONE-ACETAMINOPHEN 5-325 MG PO TABS
1.0000 | ORAL_TABLET | Freq: Three times a day (TID) | ORAL | Status: DC | PRN
  Administered 2021-03-08 – 2021-03-13 (×13): 1 via ORAL
  Filled 2021-03-07 (×13): qty 1

## 2021-03-07 MED ORDER — LEVOTHYROXINE SODIUM 75 MCG PO TABS
75.0000 ug | ORAL_TABLET | Freq: Every day | ORAL | Status: DC
  Administered 2021-03-08 – 2021-03-14 (×7): 75 ug via ORAL
  Filled 2021-03-07 (×7): qty 1

## 2021-03-07 MED ORDER — PREDNISONE 20 MG PO TABS
40.0000 mg | ORAL_TABLET | Freq: Every day | ORAL | Status: AC
  Administered 2021-03-08 – 2021-03-12 (×5): 40 mg via ORAL
  Filled 2021-03-07 (×5): qty 2

## 2021-03-07 MED ORDER — POLYETHYLENE GLYCOL 3350 17 G PO PACK
17.0000 g | PACK | Freq: Once | ORAL | Status: AC
  Administered 2021-03-08: 17 g via ORAL
  Filled 2021-03-07: qty 1

## 2021-03-07 MED ORDER — LOSARTAN POTASSIUM 50 MG PO TABS
100.0000 mg | ORAL_TABLET | Freq: Every day | ORAL | Status: DC
  Administered 2021-03-08 – 2021-03-14 (×7): 100 mg via ORAL
  Filled 2021-03-07 (×4): qty 2
  Filled 2021-03-07: qty 4
  Filled 2021-03-07 (×3): qty 2

## 2021-03-07 MED ORDER — ALBUTEROL SULFATE (2.5 MG/3ML) 0.083% IN NEBU
2.5000 mg | INHALATION_SOLUTION | Freq: Once | RESPIRATORY_TRACT | Status: AC
  Administered 2021-03-07: 2.5 mg via RESPIRATORY_TRACT
  Filled 2021-03-07: qty 3

## 2021-03-07 MED ORDER — ACETAMINOPHEN 325 MG PO TABS
650.0000 mg | ORAL_TABLET | Freq: Four times a day (QID) | ORAL | Status: DC | PRN
  Administered 2021-03-14: 650 mg via ORAL
  Filled 2021-03-07: qty 2

## 2021-03-07 MED ORDER — ALBUTEROL SULFATE (2.5 MG/3ML) 0.083% IN NEBU: 2.5000 mg | INHALATION_SOLUTION | RESPIRATORY_TRACT | Status: DC | PRN

## 2021-03-07 MED ORDER — GABAPENTIN 100 MG PO CAPS
100.0000 mg | ORAL_CAPSULE | Freq: Three times a day (TID) | ORAL | Status: DC
  Administered 2021-03-08 – 2021-03-14 (×20): 100 mg via ORAL
  Filled 2021-03-07 (×20): qty 1

## 2021-03-07 MED ORDER — POLYETHYLENE GLYCOL 3350 17 G PO PACK
17.0000 g | PACK | Freq: Every day | ORAL | Status: DC | PRN
  Filled 2021-03-07: qty 1

## 2021-03-07 MED ORDER — CARBIDOPA-LEVODOPA ER 25-100 MG PO TBCR
2.0000 | EXTENDED_RELEASE_TABLET | Freq: Every day | ORAL | Status: DC
Start: 2021-03-08 — End: 2021-03-14
  Administered 2021-03-08 – 2021-03-14 (×7): 2 via ORAL
  Filled 2021-03-07 (×7): qty 2

## 2021-03-07 MED ORDER — TEMAZEPAM 15 MG PO CAPS
30.0000 mg | ORAL_CAPSULE | Freq: Every evening | ORAL | Status: DC | PRN
  Administered 2021-03-08 – 2021-03-13 (×5): 30 mg via ORAL
  Filled 2021-03-07 (×5): qty 2

## 2021-03-07 MED ORDER — OXYCODONE HCL 5 MG PO TABS
5.0000 mg | ORAL_TABLET | Freq: Three times a day (TID) | ORAL | Status: DC | PRN
  Administered 2021-03-08 – 2021-03-13 (×11): 5 mg via ORAL
  Filled 2021-03-07 (×11): qty 1

## 2021-03-07 MED ORDER — IPRATROPIUM-ALBUTEROL 0.5-2.5 (3) MG/3ML IN SOLN: 3.0000 mL | Freq: Four times a day (QID) | RESPIRATORY_TRACT | Status: DC

## 2021-03-07 MED ORDER — DOXYCYCLINE HYCLATE 100 MG PO TABS
100.0000 mg | ORAL_TABLET | Freq: Once | ORAL | Status: AC
  Administered 2021-03-07: 100 mg via ORAL
  Filled 2021-03-07: qty 1

## 2021-03-07 MED ORDER — OXYCODONE-ACETAMINOPHEN 10-325 MG PO TABS: 1.0000 | ORAL_TABLET | Freq: Three times a day (TID) | ORAL | Status: DC | PRN

## 2021-03-07 MED ORDER — FLUTICASONE FUROATE-VILANTEROL 200-25 MCG/ACT IN AEPB: 1.0000 | INHALATION_SPRAY | Freq: Every day | RESPIRATORY_TRACT | Status: DC

## 2021-03-07 MED ORDER — IPRATROPIUM-ALBUTEROL 0.5-2.5 (3) MG/3ML IN SOLN
3.0000 mL | Freq: Three times a day (TID) | RESPIRATORY_TRACT | Status: DC
  Administered 2021-03-08 – 2021-03-10 (×7): 3 mL via RESPIRATORY_TRACT
  Filled 2021-03-07 (×7): qty 3

## 2021-03-07 MED ORDER — HYDROCHLOROTHIAZIDE 12.5 MG PO TABS
12.5000 mg | ORAL_TABLET | Freq: Every day | ORAL | Status: DC
  Administered 2021-03-08 – 2021-03-14 (×7): 12.5 mg via ORAL
  Filled 2021-03-07 (×7): qty 1

## 2021-03-07 MED ORDER — AMLODIPINE BESYLATE 5 MG PO TABS
2.5000 mg | ORAL_TABLET | Freq: Two times a day (BID) | ORAL | Status: DC
  Administered 2021-03-08 – 2021-03-14 (×13): 2.5 mg via ORAL
  Filled 2021-03-07 (×13): qty 1

## 2021-03-07 MED ORDER — PRAVASTATIN SODIUM 40 MG PO TABS
80.0000 mg | ORAL_TABLET | Freq: Every day | ORAL | Status: DC
  Administered 2021-03-08 – 2021-03-14 (×7): 80 mg via ORAL
  Filled 2021-03-07 (×4): qty 2
  Filled 2021-03-07: qty 4
  Filled 2021-03-07 (×2): qty 2

## 2021-03-07 MED ORDER — MORPHINE SULFATE ER 30 MG PO TBCR
30.0000 mg | EXTENDED_RELEASE_TABLET | Freq: Two times a day (BID) | ORAL | Status: DC
  Administered 2021-03-08 – 2021-03-14 (×13): 30 mg via ORAL
  Filled 2021-03-07 (×7): qty 1
  Filled 2021-03-07: qty 2
  Filled 2021-03-07 (×5): qty 1

## 2021-03-07 MED ORDER — METHYLPREDNISOLONE SODIUM SUCC 125 MG IJ SOLR
125.0000 mg | Freq: Once | INTRAMUSCULAR | Status: AC
  Administered 2021-03-07: 125 mg via INTRAMUSCULAR
  Filled 2021-03-07: qty 2

## 2021-03-07 MED ORDER — ACETAMINOPHEN 650 MG RE SUPP: 650.0000 mg | Freq: Four times a day (QID) | RECTAL | Status: DC | PRN

## 2021-03-07 MED ORDER — ESCITALOPRAM OXALATE 5 MG PO TABS
2.5000 mg | ORAL_TABLET | Freq: Every day | ORAL | Status: DC
  Administered 2021-03-08 – 2021-03-14 (×7): 2.5 mg via ORAL
  Filled 2021-03-07 (×7): qty 1

## 2021-03-07 MED ORDER — BUSPIRONE HCL 5 MG PO TABS
15.0000 mg | ORAL_TABLET | Freq: Two times a day (BID) | ORAL | Status: DC
  Administered 2021-03-08 – 2021-03-14 (×14): 15 mg via ORAL
  Filled 2021-03-07 (×6): qty 1
  Filled 2021-03-07: qty 2
  Filled 2021-03-07 (×6): qty 1
  Filled 2021-03-07: qty 2

## 2021-03-07 MED ORDER — SODIUM CHLORIDE 0.9% FLUSH
3.0000 mL | Freq: Two times a day (BID) | INTRAVENOUS | Status: DC
  Administered 2021-03-08 – 2021-03-13 (×11): 3 mL via INTRAVENOUS

## 2021-03-07 NOTE — ED Triage Notes (Signed)
Pt arrived via [facility] from home.   Called out for a persistent cough/dry.  Upper respiratory rhonchi per ems   Pt reports she has had a fever.  Lives with brother in law and reports she fell 3 weeks ago and is more scared with ambulated. Per brother in law pt can ambulate with walker and assistance.   140/70 P-106 91% RA Placed on 2 liters at 94%   Has not had flu shot. Has home health nurse.   A/Ox4

## 2021-03-07 NOTE — ED Provider Notes (Signed)
[facility] DEPT Provider Note   CSN: [id number] Arrival date & time: 03/07/21  1338     History Chief Complaint  Patient presents with   Cough    Fever?    Kiara Jones is a 85 y.o. female.  Patient presents with a cough for few days some shortness of breath mild fever.  Patient has a history of COPD.  The history is provided by the patient and medical records. No language interpreter was used.  Cough Cough characteristics:  Non-productive Sputum characteristics:  Nondescript Severity:  Moderate Onset quality:  Sudden Timing:  Constant Progression:  Worsening Chronicity:  New Smoker: no   Context: not animal exposure   Relieved by:  Nothing Worsened by:  Nothing Associated symptoms: wheezing   Associated symptoms: no chest pain, no eye discharge, no headaches and no rash       Past Medical History:  Diagnosis Date   Chronic kidney disease (CKD), active medical management without dialysis, stage 3 (moderate) (HCC)    COPD (chronic obstructive pulmonary disease) ([place])    Hyperlipidemia    Hypertension    Hypothyroid    Mitral valve prolapse     Patient Active Problem List   Diagnosis Date Noted   Hypertension 10/09/2020   Hyperlipidemia 10/12/2019   Allergic rhinitis 04/13/2019   Chronic obstructive pulmonary disease ([place]) 04/13/2019   Hypothyroidism 04/13/2019   Osteoporosis 04/13/2019   RLS (restless legs syndrome) 04/13/2019   Chronic ischemic heart disease 04/13/2019   Spondylosis 04/13/2019   Colitis 04/13/2019   Spinal stenosis of lumbar region 04/13/2019   Peripheral neuropathy 04/13/2019   Lymphedema of leg 04/13/2019    Past Surgical History:  Procedure Laterality Date   ABDOMINAL HYSTERECTOMY     APPENDECTOMY     BACK SURGERY     BRAIN TUMOR EXCISION     CHOLECYSTECTOMY       OB History   No obstetric history on file.     Family History  Problem Relation Age of Onset   Breast cancer Mother     CAD Father     Social History   Tobacco Use   Smoking status: Former    Types: Cigarettes, E-cigarettes   Smokeless tobacco: Former  Substance Use Topics   Alcohol use: Never   Drug use: Never    Home Medications Prior to Admission medications   Medication Sig Start Date End Date Taking? Authorizing Provider  albuterol (VENTOLIN HFA) 108 (90 Base) MCG/ACT inhaler  09/04/19   [provider]  amLODipine (NORVASC) 2.5 MG tablet Take 1 tablet (2.5 mg total) by mouth 2 (two) times daily. 10/09/20   Hilts, Legrand Como, MD  benzonatate (TESSALON PERLES) 100 MG capsule Take 1-2 capsules (100-200 mg total) by mouth 3 (three) times daily as needed for cough. 10/09/20   Hilts, Legrand Como, MD  BREO ELLIPTA 200-25 MCG/INH AEPB Inhale 1 puff into the lungs daily. 09/23/20   [provider]  busPIRone (BUSPAR) 15 MG tablet TAKE ONE TABLET BY MOUTH TWICE A DAY 07/24/20   Hilts, Legrand Como, MD  Carbidopa-Levodopa ER (SINEMET CR) 25-100 MG tablet controlled release Take 2 tablets by mouth daily. 12/28/19   Hilts, Legrand Como, MD  chlorpheniramine-HYDROcodone (TUSSIONEX PENNKINETIC ER) 10-8 MG/5ML SUER Take 5 mLs by mouth every 12 (twelve) hours as needed for cough. 05/10/20   Hilts, Legrand Como, MD  diclofenac Sodium (VOLTAREN) 1 % GEL Apply 4 g topically 4 (four) times daily as needed. 04/12/20   Hilts, Legrand Como, MD  escitalopram (  LEXAPRO) 5 MG tablet Take 0.5 tablets (2.5 mg total) by mouth daily. 08/08/20   Hilts, Casimiro Needle, MD  Fluticasone-Umeclidin-Vilant (TRELEGY ELLIPTA) 100-62.5-25 MCG/ACT AEPB Inhale 1 puff into the lungs daily. 02/08/21   Icard, Rachel Bo, DO  Fluticasone-Umeclidin-Vilant (TRELEGY ELLIPTA) 100-62.5-25 MCG/INH AEPB Inhale 1 puff into the lungs daily. 01/08/21   Icard, Rachel Bo, DO  gabapentin (NEURONTIN) 100 MG capsule Take 100 mg by mouth 3 (three) times daily. 09/11/20   [provider]  hydrochlorothiazide (HYDRODIURIL) 12.5 MG tablet Take 1 tablet (12.5 mg total) by mouth daily.  10/09/20   Hilts, Casimiro Needle, MD  HYDROcodone bit-homatropine (HYCODAN) 5-1.5 MG/5ML syrup Take 5 mLs by mouth every 6 (six) hours as needed for cough. 08/18/20   Hilts, Casimiro Needle, MD  levothyroxine (SYNTHROID) 75 MCG tablet Take 1 tablet (75 mcg total) by mouth daily before breakfast. 10/09/20   Hilts, Casimiro Needle, MD  losartan (COZAAR) 100 MG tablet Take 1 tablet (100 mg total) by mouth daily. 10/09/20   Hilts, Casimiro Needle, MD  morphine (MS CONTIN) 30 MG 12 hr tablet Take 30 mg by mouth 2 (two) times daily. 04/02/19   [provider]  oxyCODONE-acetaminophen (PERCOCET) 10-325 MG tablet Take 1 tablet by mouth every 4 (four) hours as needed. 04/02/19   [provider]  pravastatin (PRAVACHOL) 80 MG tablet Take 1 tablet (80 mg total) by mouth daily. 10/09/20   Hilts, Casimiro Needle, MD  temazepam (RESTORIL) 30 MG capsule Take 1 capsule (30 mg total) by mouth at bedtime as needed for sleep. 04/13/19   Hilts, Casimiro Needle, MD    Allergies    Gabapentin, Lyrica [pregabalin], Alprazolam, Aspirin, Codeine, Nsaids, Nutritional supplements, Penicillins, Sertraline, Tape, Tapentadol, Silicone, and Sulfa antibiotics  Review of Systems   Review of Systems  Constitutional:  Negative for appetite change and fatigue.  HENT:  Negative for congestion, ear discharge and sinus pressure.   Eyes:  Negative for discharge.  Respiratory:  Positive for cough and wheezing.   Cardiovascular:  Negative for chest pain.  Gastrointestinal:  Negative for abdominal pain and diarrhea.  Genitourinary:  Negative for frequency and hematuria.  Musculoskeletal:  Negative for back pain.  Skin:  Negative for rash.  Neurological:  Negative for seizures and headaches.  Psychiatric/Behavioral:  Negative for hallucinations.    Physical Exam Updated Vital Signs BP 114/70 (BP Location: Right Arm)   Pulse 85   Temp 98.4 F (36.9 C) (Oral)   Resp 16   Ht 4\' 9"  (1.448 m)   Wt 45.8 kg   SpO2 98%   BMI 21.86 kg/m   Physical Exam Vitals  and nursing note reviewed.  Constitutional:      Appearance: She is well-developed.  HENT:     Head: Normocephalic.     Nose: Nose normal.  Eyes:     General: No scleral icterus.    Conjunctiva/sclera: Conjunctivae normal.  Neck:     Thyroid: No thyromegaly.  Cardiovascular:     Rate and Rhythm: Normal rate and regular rhythm.     Heart sounds: No murmur heard.   No friction rub. No gallop.  Pulmonary:     Breath sounds: No stridor. No wheezing or rales.  Chest:     Chest wall: No tenderness.  Abdominal:     General: There is no distension.     Tenderness: There is no abdominal tenderness. There is no rebound.  Musculoskeletal:        General: Normal range of motion.     Cervical back: Neck  supple.  Lymphadenopathy:     Cervical: No cervical adenopathy.  Skin:    Findings: No erythema or rash.  Neurological:     Mental Status: She is alert and oriented to person, place, and time.     Motor: No abnormal muscle tone.     Coordination: Coordination normal.  Psychiatric:        Behavior: Behavior normal.    ED Results / Procedures / Treatments   Labs (all labs ordered are listed, but only abnormal results are displayed) Labs Reviewed  BASIC METABOLIC PANEL - Abnormal; Notable for the following components:      Result Value   Chloride 96 (*)    All other components within normal limits  CBC WITH DIFFERENTIAL/PLATELET - Abnormal; Notable for the following components:   WBC 12.9 (*)    Neutro Abs 10.5 (*)    Monocytes Absolute 1.3 (*)    All other components within normal limits  RESP PANEL BY RT-PCR (FLU A&B, COVID) [id number]    EKG None  Radiology DG Chest Port 1 View  Result Date: 03/07/2021 CLINICAL DATA:  Shortness of breath, cough. EXAM: PORTABLE CHEST 1 VIEW COMPARISON:  January 22, 2021. FINDINGS: The heart size and mediastinal contours are within normal limits. Both lungs are clear. The visualized skeletal structures are unremarkable. IMPRESSION: No active  disease. Aortic Atherosclerosis (ICD10-I70.0). Electronically Signed   By: Marijo Conception M.D.   On: 03/07/2021 14:14    Procedures Procedures   Medications Ordered in ED Medications  ipratropium-albuterol (DUONEB) 0.5-2.5 (3) MG/3ML nebulizer solution 3 mL (3 mLs Nebulization Given 03/07/21 2013)  albuterol (PROVENTIL) (2.5 MG/3ML) 0.083% nebulizer solution 2.5 mg (2.5 mg Nebulization Given 03/07/21 2013)  methylPREDNISolone sodium succinate (SOLU-MEDROL) 125 mg/2 mL injection 125 mg (125 mg Intramuscular Given 03/07/21 2013)  doxycycline (VIBRA-TABS) tablet 100 mg (100 mg Oral Given 03/07/21 2023)    ED Course  I have reviewed the triage vital signs and the nursing notes.  Pertinent labs & imaging results that were available during my care of the patient were reviewed by me and considered in my medical decision making (see chart for details).    MDM Rules/Calculators/A&P                           Patient with COPD exacerbation and hypoxia.  Patient's room air sats are 87% after neb treatments.  She will be admitted to medicine for continued treatment COPD exacerbation Final Clinical Impression(s) / ED Diagnoses Final diagnoses:  COPD exacerbation [facility])    Rx / DC Orders ED Discharge Orders     None        Milton Ferguson, MD 03/07/21 2137

## 2021-03-07 NOTE — ED Provider Notes (Signed)
Emergency Medicine Provider Triage Evaluation Note  Kiara Jones , a 85 y.o. female  was evaluated in triage.  Pt complains of sob and cough for 3-4 days. Hx copd, not on O2 at home. Has had fevers at home as well.  Review of Systems  Positive: Cough, sob, fever Negative: Chest pain  Physical Exam  BP 120/73 (BP Location: Left Arm)   Pulse (!) 105   Temp 98.4 F (36.9 C) (Oral)   Resp 14   Ht 4\' 9"  (1.448 m)   Wt 45.8 kg   BMI 21.86 kg/m  Gen:   Awake, no distress   Resp:  Normal effort  MSK:   Moves extremities without difficulty  Other:  Bibasilar rales, faint wheezing, prolonged expiratory phase  Medical Decision Making  Medically screening exam initiated at 1:54 PM.  Appropriate orders placed.  Kiara Jones was informed that the remainder of the evaluation will be completed by another provider, this initial triage assessment does not replace that evaluation, and the importance of remaining in the ED until their evaluation is complete.     Vinnie Langton, PA-C 03/07/21 1356    03/09/21, MD 03/10/21 1549

## 2021-03-07 NOTE — H&P (Addendum)
History and Physical   Kiara Jones [id number] DOB: 02-28-1936 DOA: 03/07/2021  PCP: Emelia Loron, NP  Patient coming from: Home  Chief Complaint: Shortness of breath, cough  HPI: Kiara Jones is a 85 y.o. female with medical history significant of COPD, hyperlipidemia, hypertension, hypothyroidism, lymphedema, RLS, neuropathy, spinal stenosis, CKD who presents with ongoing shortness of breath and cough.  Patient states that she has progressive shortness of breath and cough for the past 3 to 4 days.  She states it was initially productive of sputum however since then she has had trouble getting sputum up. Has not been improving so she called called her doctor's office who recommend that she come in for a visit where she come to the ED for further evaluation.  She did not like either option and stated she was going to do neither.  She then gave the phone to her brother-in-law who apparently called the ambulance to transport her to the ED for further evaluation.    She does report fever at home she measured it at 99.8.  She additionally reports some dysuria, increased urinary frequency and some odor to her urine. She also notes that she fell few weeks ago and has some hesitancy to walk at this time.  Family member reported that she will walk with a walker and assistance.  Her doctor outpatient had already referred her to PT but she has not seen them yet. Also reports constipation.   She denies chills, chest pain, abdominal pain, diarrhea, nausea, vomiting  ED Course: Vital signs in the ED significant for desaturation in the upper 80s on room air despite treatments and saturating well on 2 L.  Lab work-up showed BMP with chloride 96.  CBC with leukocytosis to 12.9.  Respiratory panel for flu and COVID-negative.  Chest x-ray showed no acute abnormalities.  Patient received Solu-Medrol, duo nebs, albuterol, doxycycline in the ED.  Review of Systems: As per HPI otherwise all other systems  reviewed and are negative.  Past Medical History:  Diagnosis Date   Chronic kidney disease (CKD), active medical management without dialysis, stage 3 (moderate) (HCC)    COPD (chronic obstructive pulmonary disease) (HCC)    Hyperlipidemia    Hypertension    Hypothyroid    Mitral valve prolapse     Past Surgical History:  Procedure Laterality Date   ABDOMINAL HYSTERECTOMY     APPENDECTOMY     BACK SURGERY     BRAIN TUMOR EXCISION     CHOLECYSTECTOMY      Social History  reports that she has quit smoking. Her smoking use included cigarettes and e-cigarettes. She has quit using smokeless tobacco. She reports that she does not drink alcohol and does not use drugs.  Allergies  Allergen Reactions   Gabapentin Shortness Of Breath and Other (See Comments)    Fatigue, blurred vision   Lyrica [Pregabalin] Shortness Of Breath and Other (See Comments)    Blurred vision, fatigue   Alprazolam Other (See Comments)    Sick to stomach, tremors inside & out, loss of appetite   Aspirin Other (See Comments)   Codeine Other (See Comments)    Heartburn, ulcer upset indigestion   Nsaids Other (See Comments)    "upsets my ulcer"    Nutritional Supplements Other (See Comments)   Penicillins Other (See Comments)    Cold sweat, almost passed out   Sertraline Other (See Comments)    Sick to stomach, tremors inside & out, loss of appetite   Tape  Tapentadol Other (See Comments)   Silicone Rash    Silicone-based pain patches - caused a rash   Sulfa Antibiotics Rash    Family History  Problem Relation Age of Onset   Breast cancer Mother    CAD Father   Reviewed on admission  Prior to Admission medications   Medication Sig Start Date End Date Taking? Authorizing Provider  albuterol (VENTOLIN HFA) 108 (90 Base) MCG/ACT inhaler  09/04/19   [provider]  amLODipine (NORVASC) 2.5 MG tablet Take 1 tablet (2.5 mg total) by mouth 2 (two) times daily. 10/09/20   Hilts, Legrand Como, MD   benzonatate (TESSALON PERLES) 100 MG capsule Take 1-2 capsules (100-200 mg total) by mouth 3 (three) times daily as needed for cough. 10/09/20   Hilts, Legrand Como, MD  BREO ELLIPTA 200-25 MCG/INH AEPB Inhale 1 puff into the lungs daily. 09/23/20   [provider]  busPIRone (BUSPAR) 15 MG tablet TAKE ONE TABLET BY MOUTH TWICE A DAY 07/24/20   Hilts, Legrand Como, MD  Carbidopa-Levodopa ER (SINEMET CR) 25-100 MG tablet controlled release Take 2 tablets by mouth daily. 12/28/19   Hilts, Legrand Como, MD  chlorpheniramine-HYDROcodone (TUSSIONEX PENNKINETIC ER) 10-8 MG/5ML SUER Take 5 mLs by mouth every 12 (twelve) hours as needed for cough. 05/10/20   Hilts, Legrand Como, MD  diclofenac Sodium (VOLTAREN) 1 % GEL Apply 4 g topically 4 (four) times daily as needed. 04/12/20   Hilts, Legrand Como, MD  escitalopram (LEXAPRO) 5 MG tablet Take 0.5 tablets (2.5 mg total) by mouth daily. 08/08/20   Hilts, Legrand Como, MD  Fluticasone-Umeclidin-Vilant (TRELEGY ELLIPTA) 100-62.5-25 MCG/ACT AEPB Inhale 1 puff into the lungs daily. 02/08/21   Icard, Octavio Graves, DO  Fluticasone-Umeclidin-Vilant (TRELEGY ELLIPTA) 100-62.5-25 MCG/INH AEPB Inhale 1 puff into the lungs daily. 01/08/21   Icard, Octavio Graves, DO  gabapentin (NEURONTIN) 100 MG capsule Take 100 mg by mouth 3 (three) times daily. 09/11/20   [provider]  hydrochlorothiazide (HYDRODIURIL) 12.5 MG tablet Take 1 tablet (12.5 mg total) by mouth daily. 10/09/20   Hilts, Legrand Como, MD  HYDROcodone bit-homatropine (HYCODAN) 5-1.5 MG/5ML syrup Take 5 mLs by mouth every 6 (six) hours as needed for cough. 08/18/20   Hilts, Legrand Como, MD  levothyroxine (SYNTHROID) 75 MCG tablet Take 1 tablet (75 mcg total) by mouth daily before breakfast. 10/09/20   Hilts, Legrand Como, MD  losartan (COZAAR) 100 MG tablet Take 1 tablet (100 mg total) by mouth daily. 10/09/20   Hilts, Legrand Como, MD  morphine (MS CONTIN) 30 MG 12 hr tablet Take 30 mg by mouth 2 (two) times daily. 04/02/19   [provider]   oxyCODONE-acetaminophen (PERCOCET) 10-325 MG tablet Take 1 tablet by mouth every 4 (four) hours as needed. 04/02/19   [provider]  pravastatin (PRAVACHOL) 80 MG tablet Take 1 tablet (80 mg total) by mouth daily. 10/09/20   Hilts, Legrand Como, MD  temazepam (RESTORIL) 30 MG capsule Take 1 capsule (30 mg total) by mouth at bedtime as needed for sleep. 04/13/19   Kiara Blase, MD    Physical Exam: Vitals:   03/07/21 2030 03/07/21 2100 03/07/21 2126 03/07/21 2200  BP: (!) 147/82 (!) 149/76  (!) 152/74  Pulse: 86 93 92 98  Resp:      Temp:      TempSrc:      SpO2: 100% (!) 88% 95% 99%  Weight:      Height:        Physical Exam Constitutional:      General: She is not in  acute distress.    Appearance: Normal appearance.  HENT:     Head: Normocephalic and atraumatic.     Mouth/Throat:     Mouth: Mucous membranes are moist.     Pharynx: Oropharynx is clear.  Eyes:     Extraocular Movements: Extraocular movements intact.     Pupils: Pupils are equal, round, and reactive to light.  Cardiovascular:     Rate and Rhythm: Normal rate and regular rhythm.     Pulses: Normal pulses.     Heart sounds: Normal heart sounds.  Pulmonary:     Effort: Pulmonary effort is normal. No respiratory distress.     Breath sounds: Wheezing present.  Abdominal:     General: Bowel sounds are normal. There is no distension.     Palpations: Abdomen is soft.     Tenderness: There is no abdominal tenderness.  Musculoskeletal:        General: No swelling or deformity.     Comments: Cast left forarm  Skin:    General: Skin is warm and dry.  Neurological:     General: No focal deficit present.     Mental Status: Mental status is at baseline.   Labs on Admission: I have personally reviewed following labs and imaging studies  CBC: Recent Labs  Lab 03/07/21 1421  WBC 12.9*  NEUTROABS 10.5*  HGB 13.7  HCT 42.3  MCV 92.2  PLT 230    Basic Metabolic Panel: Recent Labs  Lab  03/07/21 1421  NA 135  K 3.7  CL 96*  CO2 30  GLUCOSE 99  BUN 18  CREATININE 0.75  CALCIUM 9.8    GFR: Estimated Creatinine Clearance: 32.5 mL/min (by C-G formula based on SCr of 0.75 mg/dL).  Liver Function Tests: No results for input(s): AST, ALT, ALKPHOS, BILITOT, PROT, ALBUMIN in the last 168 hours.  Urine analysis:    Component Value Date/Time   COLORURINE YELLOW 10/09/2020 1435   APPEARANCEUR CLEAR 10/09/2020 1435   LABSPEC 1.016 10/09/2020 1435   PHURINE 7.0 10/09/2020 1435   GLUCOSEU NEGATIVE 10/09/2020 1435   HGBUR NEGATIVE 10/09/2020 1435   KETONESUR NEGATIVE 10/09/2020 1435   PROTEINUR NEGATIVE 10/09/2020 1435    Radiological Exams on Admission: DG Chest Port 1 View  Result Date: 03/07/2021 CLINICAL DATA:  Shortness of breath, cough. EXAM: PORTABLE CHEST 1 VIEW COMPARISON:  January 22, 2021. FINDINGS: The heart size and mediastinal contours are within normal limits. Both lungs are clear. The visualized skeletal structures are unremarkable. IMPRESSION: No active disease. Aortic Atherosclerosis (ICD10-I70.0). Electronically Signed   By: Lupita Raider M.D.   On: 03/07/2021 14:14    EKG: Independently reviewed.  Sinus rhythm with sinus arrhythmia at 96 bpm.  Some baseline artifact and mild baseline wander.  Some nonspecific T wave flattening lateral leads.  Assessment/Plan Principal Problem:   COPD exacerbation (HCC) Active Problems:   Hypothyroidism   RLS (restless legs syndrome)   Spinal stenosis of lumbar region   Hyperlipidemia   Hypertension  COPD exacerbation Acute respiratory failure with hypoxia > Known history of COPD with worsening shortness of breath and cough past few days. > Saturating in the upper 80s on room air not on oxygen at home.  Requiring 2 L to maintain saturations in the ED. > Does have leukocytosis 12.9 the chest x-ray is clear.  Respiratory panel for flu and COVID also negative.  Possible other viral etiology as trigger. >  Received Solu-Medrol, DuoNeb, albuterol and a dose of doxycycline in  the ED. - Monitor on telemetry - Wean oxygen as tolerated - Prednisone 40 mg daily - Scheduled duo nebs - As needed albuterol - Continue home Trelegy   Leukocytosis ?UTI > Reports urinary frequency and dysuria in addition to odor. - Follow up U/A, will likely need antibiotics (did receive doxy in ED for COPD exacerbation)  Hypothyroidism - Continue home Synthroid  Hyperlipidemia - Continue home pravastatin  Hypertension - Continue amlodipine, hydrochlorothiazide, losartan  RLS - Continue home carbidopa levodopa   Neuropathy - Continue home gabapentin  Anxiety Depression - Continue home BuSpar, Lexapro, temazepam  Spinal stenosis Chronic back pain - Continue home morphine and as needed oxycodone  Recent fall > Injured her arm currently has a cast on that arm.  Has had referral to PT but has been seeing it. - PT OT eval and treat  DVT prophylaxis: Lovenox  Code Status:   Full   Family Communication:  Brother-in-law and caretaker, Kiara Jones, updated by phone Disposition Plan:   Patient is from:  Home  Anticipated DC to:  Home  Anticipated DC date:  1 to 3 days  Anticipated DC barriers: None  Consults called:  None  Admission status:  Observation, telemetry   Severity of Illness: The appropriate patient status for this patient is OBSERVATION. Observation status is judged to be reasonable and necessary in order to provide the required intensity of service to ensure the patient's safety. The patient's presenting symptoms, physical exam findings, and initial radiographic and laboratory data in the context of their medical condition is felt to place them at decreased risk for further clinical deterioration. Furthermore, it is anticipated that the patient will be medically stable for discharge from the hospital within 2 midnights of admission.   Marcelyn Bruins MD [facility]  How to  contact the [facility] Attending or Consulting provider [place] or covering provider during after hours [place], for this patient?   Check the care team in [facility] and look for a) attending/consulting [facility] provider listed and b) the [facility] team listed Log into [email or web address] and use [place]'s universal password to access. If you do not have the password, please contact the hospital operator. Locate the [facility] provider you are looking for under [facility] and page to a number that you can be directly reached. If you still have difficulty reaching the provider, please page the [facility] (Director on Call) for the Hospitalists listed on amion for assistance.  03/07/2021, 10:17 PM

## 2021-03-08 ENCOUNTER — Other Ambulatory Visit: Payer: Self-pay

## 2021-03-08 ENCOUNTER — Encounter (HOSPITAL_COMMUNITY): Payer: Self-pay | Admitting: Internal Medicine

## 2021-03-08 DIAGNOSIS — Z87891 Personal history of nicotine dependence: Secondary | ICD-10-CM | POA: Diagnosis not present

## 2021-03-08 DIAGNOSIS — M549 Dorsalgia, unspecified: Secondary | ICD-10-CM | POA: Diagnosis present

## 2021-03-08 DIAGNOSIS — M48061 Spinal stenosis, lumbar region without neurogenic claudication: Secondary | ICD-10-CM | POA: Diagnosis present

## 2021-03-08 DIAGNOSIS — N390 Urinary tract infection, site not specified: Secondary | ICD-10-CM | POA: Diagnosis present

## 2021-03-08 DIAGNOSIS — Z7951 Long term (current) use of inhaled steroids: Secondary | ICD-10-CM | POA: Diagnosis not present

## 2021-03-08 DIAGNOSIS — F32A Depression, unspecified: Secondary | ICD-10-CM | POA: Diagnosis present

## 2021-03-08 DIAGNOSIS — G2581 Restless legs syndrome: Secondary | ICD-10-CM | POA: Diagnosis present

## 2021-03-08 DIAGNOSIS — Z8249 Family history of ischemic heart disease and other diseases of the circulatory system: Secondary | ICD-10-CM | POA: Diagnosis not present

## 2021-03-08 DIAGNOSIS — G629 Polyneuropathy, unspecified: Secondary | ICD-10-CM | POA: Diagnosis present

## 2021-03-08 DIAGNOSIS — Z20822 Contact with and (suspected) exposure to covid-19: Secondary | ICD-10-CM | POA: Diagnosis present

## 2021-03-08 DIAGNOSIS — Z79890 Hormone replacement therapy (postmenopausal): Secondary | ICD-10-CM | POA: Diagnosis not present

## 2021-03-08 DIAGNOSIS — G8929 Other chronic pain: Secondary | ICD-10-CM | POA: Diagnosis present

## 2021-03-08 DIAGNOSIS — Z79899 Other long term (current) drug therapy: Secondary | ICD-10-CM | POA: Diagnosis not present

## 2021-03-08 DIAGNOSIS — I341 Nonrheumatic mitral (valve) prolapse: Secondary | ICD-10-CM | POA: Diagnosis present

## 2021-03-08 DIAGNOSIS — W19XXXD Unspecified fall, subsequent encounter: Secondary | ICD-10-CM | POA: Diagnosis present

## 2021-03-08 DIAGNOSIS — E785 Hyperlipidemia, unspecified: Secondary | ICD-10-CM | POA: Diagnosis present

## 2021-03-08 DIAGNOSIS — L89151 Pressure ulcer of sacral region, stage 1: Secondary | ICD-10-CM | POA: Diagnosis present

## 2021-03-08 DIAGNOSIS — F419 Anxiety disorder, unspecified: Secondary | ICD-10-CM | POA: Diagnosis present

## 2021-03-08 DIAGNOSIS — I129 Hypertensive chronic kidney disease with stage 1 through stage 4 chronic kidney disease, or unspecified chronic kidney disease: Secondary | ICD-10-CM | POA: Diagnosis present

## 2021-03-08 DIAGNOSIS — R0602 Shortness of breath: Secondary | ICD-10-CM | POA: Diagnosis present

## 2021-03-08 DIAGNOSIS — N182 Chronic kidney disease, stage 2 (mild): Secondary | ICD-10-CM | POA: Diagnosis present

## 2021-03-08 DIAGNOSIS — Z803 Family history of malignant neoplasm of breast: Secondary | ICD-10-CM | POA: Diagnosis not present

## 2021-03-08 DIAGNOSIS — B952 Enterococcus as the cause of diseases classified elsewhere: Secondary | ICD-10-CM | POA: Diagnosis present

## 2021-03-08 DIAGNOSIS — J441 Chronic obstructive pulmonary disease with (acute) exacerbation: Secondary | ICD-10-CM | POA: Diagnosis present

## 2021-03-08 DIAGNOSIS — J9601 Acute respiratory failure with hypoxia: Secondary | ICD-10-CM | POA: Diagnosis present

## 2021-03-08 DIAGNOSIS — E039 Hypothyroidism, unspecified: Secondary | ICD-10-CM | POA: Diagnosis present

## 2021-03-08 LAB — URINALYSIS, COMPLETE (UACMP) WITH MICROSCOPIC
Bilirubin Urine: NEGATIVE
Glucose, UA: NEGATIVE mg/dL
Hgb urine dipstick: NEGATIVE
Ketones, ur: NEGATIVE mg/dL
Nitrite: NEGATIVE
Protein, ur: NEGATIVE mg/dL
Specific Gravity, Urine: 1.015 (ref 1.005–1.030)
WBC, UA: >50 WBC/hpf — ABNORMAL HIGH (ref 0–5)
pH: 6 (ref 5.0–8.0)

## 2021-03-08 LAB — RESPIRATORY PANEL BY PCR

## 2021-03-08 LAB — CBC
HCT: 40.6 % (ref 36.0–46.0)
Hemoglobin: 13.3 g/dL (ref 12.0–15.0)
MCH: 30.2 pg (ref 26.0–34.0)
MCHC: 32.8 g/dL (ref 30.0–36.0)
MCV: 92.1 fL (ref 80.0–100.0)
Platelets: 198 10*3/uL (ref 150–400)
RBC: 4.41 MIL/uL (ref 3.87–5.11)
RDW: 13.6 % (ref 11.5–15.5)
WBC: 10.2 10*3/uL (ref 4.0–10.5)
nRBC: 0 % (ref 0.0–0.2)

## 2021-03-08 MED ORDER — UMECLIDINIUM BROMIDE 62.5 MCG/ACT IN AEPB
1.0000 | INHALATION_SPRAY | Freq: Every day | RESPIRATORY_TRACT | Status: DC
  Administered 2021-03-10 – 2021-03-14 (×5): 1 via RESPIRATORY_TRACT
  Filled 2021-03-08 (×2): qty 7

## 2021-03-08 MED ORDER — FLUTICASONE FUROATE-VILANTEROL 100-25 MCG/ACT IN AEPB
1.0000 | INHALATION_SPRAY | Freq: Every day | RESPIRATORY_TRACT | Status: DC
  Administered 2021-03-10 – 2021-03-14 (×5): 1 via RESPIRATORY_TRACT
  Filled 2021-03-08 (×2): qty 28

## 2021-03-08 MED ORDER — SODIUM CHLORIDE 0.9 % IV SOLN
1.0000 g | INTRAVENOUS | Status: DC
  Administered 2021-03-08 – 2021-03-09 (×2): 1 g via INTRAVENOUS
  Filled 2021-03-08 (×3): qty 10

## 2021-03-08 NOTE — Evaluation (Signed)
Occupational Therapy Evaluation Patient Details Name: Kiara Jones MRN: [id number] DOB: 1935-04-26 Today's Date: 03/08/2021   History of Present Illness Kiara Jones is a 85 y.o. female with medical history significant of COPD, hyperlipidemia, hypertension, hypothyroidism, lymphedema, RLS, neuropathy, spinal stenosis, CKD who presents to ED 03/07/21 with ongoing shortness of breath and cough. post fall 01/22/21 and sustained  distal left radius and ulna fracture/splinted in ED   Clinical Impression   Kiara Jones is an 85 year old woman who presents from home with generalized weakness, decreased activity tolerance, impaired balance, and pain. She has a history of falling and recently broke her left wrist and reports she has mostly been in the bed since with her brother in law helping her with ADLs and mobility. Also reports receiving HH PT. Today patient requiring +2 assistance for ADLs - one person to steady patient with sit to stand the other person to assist with the ADL task. Patient required +2 hand olds to stand and take steps at side of bed. Overall she is mostly oriented but does exhibit some slow processing and needing questions repeated. Patient will benefit from skilled OT services while in hospital to improve deficits and learn compensatory strategies as needed in order to return to PLOF.  Recommend short term rehab at discharge to maximize her functional abilities prior to return home in order to reduce caregiver burden.      Recommendations for follow up therapy are one component of a multi-disciplinary discharge planning process, led by the attending physician.  Recommendations may be updated based on patient status, additional functional criteria and insurance authorization.   Follow Up Recommendations  Skilled nursing-short term rehab (<3 hours/day)    Assistance Recommended at Discharge Frequent or constant Supervision/Assistance  Functional Status Assessment   Patient has had a recent decline in their functional status and demonstrates the ability to make significant improvements in function in a reasonable and predictable amount of time.  Equipment Recommendations  Other (comment) (TBD)    Recommendations for Other Services       Precautions / Restrictions Precautions Precautions: Fall Restrictions Weight Bearing Restrictions: Yes LUE Weight Bearing: Non weight bearing Other Position/Activity Restrictions: , assume NWB hand wrist- in cast      Mobility Bed Mobility Overal bed mobility: Needs Assistance Bed Mobility: Supine to Sit     Supine to sit: Max assist;+2 for physical assistance;+2 for safety/equipment     General bed mobility comments: assist with legs and trunk to retrun to supine    Transfers Overall transfer level: Needs assistance Equipment used: 2 person hand held assist Transfers: Sit to/from Stand Sit to Stand: Min assist;+2 safety/equipment           General transfer comment: stands from stedy and gurney with 2 armhold assist. Able to side step x 3 along gurney with bil. arm hold      Balance Overall balance assessment: Needs assistance Sitting-balance support: Single extremity supported;Feet supported Sitting balance-Leahy Scale: Fair Sitting balance - Comments: sits at mid line   Standing balance support: During functional activity;Bilateral upper extremity supported Standing balance-Leahy Scale: Poor Standing balance comment: required external support                           ADL either performed or assessed with clinical judgement   ADL Overall ADL's : Needs assistance/impaired Eating/Feeding: Set up;Bed level   Grooming: Set up;Bed level   Upper Body Bathing: Moderate assistance;Bed level  Lower Body Bathing: Total assistance;Bed level   Upper Body Dressing : Maximal assistance;Bed level   Lower Body Dressing: Total assistance;Bed level   Toilet Transfer: Total  assistance;+2 for physical assistance   Toileting- Clothing Manipulation and Hygiene: Total assistance;Sit to/from stand   Tub/ Engineer, structural: Total assistance;+2 for physical assistance   Functional mobility during ADLs: +2 for physical assistance;Moderate assistance General ADL Comments: Requires one assist for steadying in standing and the second for assisting with the ADL task.     Vision   Vision Assessment?: No apparent visual deficits     Perception     Praxis      Pertinent Vitals/Pain Pain Assessment: Faces Faces Pain Scale: Hurts even more Pain Location: left fingers and forearm Pain Descriptors / Indicators: Discomfort;Grimacing;Guarding Pain Intervention(s): Limited activity within patient's tolerance     Hand Dominance Right   Extremity/Trunk Assessment Upper Extremity Assessment Upper Extremity Assessment: RUE deficits/detail;LUE deficits/detail RUE Deficits / Details: WFL ROm, grossly 3+/5 strength RUE Sensation: WNL RUE Coordination: WNL LUE Deficits / Details: Grossly functional ROM of shoulder and elbow. Wrist in hard cast. Patinet unable to flex fingers fully and reports pain with passive ROM. Fingers swollen. LUE Sensation: WNL LUE Coordination: decreased fine motor   Lower Extremity Assessment Lower Extremity Assessment: Defer to PT evaluation RLE Deficits / Details: bears more weight than on left, LLE Deficits / Details: noted tremors of Leg when standing   Cervical / Trunk Assessment Cervical / Trunk Assessment: Kyphotic   Communication Communication Communication: Expressive difficulties (at times difficulty in completing  thoughts)   Cognition Arousal/Alertness: Awake/alert Behavior During Therapy: WFL for tasks assessed/performed Overall Cognitive Status: No family/caregiver present to determine baseline cognitive functioning Area of Impairment: Orientation                 Orientation Level: Time             General  Comments: reports broke wrist 10/3, able to describe  that she is basically bedbound except to amb with assistance to BR and with HHPT(? # visits). mostly fuctional - increased time to process questions.     General Comments       Exercises     Shoulder Instructions      Home Living Family/patient expects to be discharged to:: Private residence Living Arrangements:  (brother in law lives w/ pt.) Available Help at Discharge: Family;Available 24 hours/day Type of Home: House Home Access: Level entry     Home Layout: One level               Home Equipment: Rollator (4 wheels);BSC/3in1          Prior Functioning/Environment Prior Level of Function : Needs assist;History of Falls (last six months)  Cognitive Assist : Mobility (cognitive)     Physical Assist : Mobility (physical);ADLs (physical) Mobility (physical): Bed mobility;Transfers;Gait ADLs (physical): Bathing;Dressing;Toileting Mobility Comments: brother in law assists to walk to BR for BM, uses BSC O/W, gets assistance for  bath, reports loss of mobility since fall and fracture.          OT Problem List: Decreased strength;Decreased range of motion;Decreased activity tolerance;Impaired balance (sitting and/or standing);Decreased coordination;Decreased safety awareness;Decreased knowledge of use of DME or AE;Decreased knowledge of precautions;Impaired UE functional use;Pain      OT Treatment/Interventions: Self-care/ADL training;Therapeutic exercise;DME and/or AE instruction;Balance training;Patient/family education;Therapeutic activities    OT Goals(Current goals can be found in the care plan section) Acute Rehab OT Goals Patient Stated Goal: to  go home OT Goal Formulation: With patient Time For Goal Achievement: 03/22/21 Potential to Achieve Goals: Fair  OT Frequency: Min 2X/week   Barriers to D/C:            Co-evaluation PT/OT/SLP Co-Evaluation/Treatment: Yes Reason for Co-Treatment: To address  functional/ADL transfers PT goals addressed during session: Mobility/safety with mobility OT goals addressed during session: ADL's and self-care      AM-PAC OT "6 Clicks" Daily Activity     Outcome Measure Help from another person eating meals?: A Little Help from another person taking care of personal grooming?: A Little Help from another person toileting, which includes using toliet, bedpan, or urinal?: Total Help from another person bathing (including washing, rinsing, drying)?: A Lot Help from another person to put on and taking off regular upper body clothing?: A Lot Help from another person to put on and taking off regular lower body clothing?: Total 6 Click Score: 12   End of Session Nurse Communication:  (okay to see)  Activity Tolerance: Patient tolerated treatment well Patient left: in bed;with call bell/phone within reach  OT Visit Diagnosis: Unsteadiness on feet (R26.81);Muscle weakness (generalized) (M62.81);Pain                Time: 1950-9326 OT Time Calculation (min): 20 min Charges:  OT General Charges $OT Visit: 1 Visit OT Evaluation $OT Eval Low Complexity: 1 Low  Bridgitt Raggio, OTR/L Acute Care Rehab Services  Office [phone] Pager: [phone]   Kelli Churn 03/08/2021, 11:48 AM

## 2021-03-08 NOTE — Evaluation (Signed)
Physical Therapy Evaluation Patient Details Name: Kiara Jones MRN: [id number] DOB: Sep 10, 1935 Today's Date: 03/08/2021  History of Present Illness  Kiara Jones is a 85 y.o. female with medical history significant of COPD, hyperlipidemia, hypertension, hypothyroidism, lymphedema, RLS, neuropathy, spinal stenosis, CKD who presents to ED 03/07/21 with ongoing shortness of breath and cough. post fall 01/22/21 and sustained  distal left radius and ulna fracture/splinted in ED  Clinical Impression  Patient intercepted from BR with CNA. Patient noted to be very unsteady so assisted to step onto STEDY and rolled back to room. Patient stands with mod assistance with UE support.   Patient  reports that her brother in law assists with ADL's and is limited ambulation. Patient reports HHPT is involved.There is also a brother in the picture but patient did not state if he is supportive for ADL's.  Patient currently requires at least 1 person assist. Patient can benefit from short rehab stay to improve strength and function. Family not present to discuss level of assistance available.      Recommendations for follow up therapy are one component of a multi-disciplinary discharge planning process, led by the attending physician.  Recommendations may be updated based on patient status, additional functional criteria and insurance authorization.  Follow Up Recommendations Skilled nursing-short term rehab (<3 hours/day)    Assistance Recommended at Discharge Frequent or constant Supervision/Assistance  Functional Status Assessment Patient has had a recent decline in their functional status and demonstrates the ability to make significant improvements in function in a reasonable and predictable amount of time.  Equipment Recommendations  None recommended by PT    Recommendations for Other Services       Precautions / Restrictions Precautions Precautions: Fall Restrictions Weight Bearing  Restrictions: Yes LUE Weight Bearing: Non weight bearing Other Position/Activity Restrictions: , assume NWB hand wrist- in cast      Mobility  Bed Mobility Overal bed mobility: Needs Assistance Bed Mobility: Supine to Sit     Supine to sit: Max assist;+2 for physical assistance;+2 for safety/equipment     General bed mobility comments: assist with legs and trunk to retrun to supine    Transfers Overall transfer level: Needs assistance Equipment used: 2 person hand held assist Transfers: Sit to/from Stand             General transfer comment: stands from stedy and gurney with 2 armhold assist. Able to side step x 3 along gurney with bil. arm hold    Ambulation/Gait               General Gait Details: TBA- too weak to ambulate from BR, transported on STEDY  Stairs            Wheelchair Mobility    Modified Rankin (Stroke Patients Only)       Balance Overall balance assessment: Needs assistance Sitting-balance support: Single extremity supported;Feet supported Sitting balance-Leahy Scale: Fair Sitting balance - Comments: sits at mid line   Standing balance support: During functional activity;Bilateral upper extremity supported Standing balance-Leahy Scale: Poor Standing balance comment: supported on UE's                             Pertinent Vitals/Pain Pain Assessment: Faces Faces Pain Scale: Hurts even more Pain Location: left fingers and forearm Pain Descriptors / Indicators: Discomfort;Grimacing;Guarding Pain Intervention(s): Monitored during session;Limited activity within patient's tolerance    Home Living Family/patient expects to be discharged to:: Private residence Living Arrangements:  (  brother in law lives w/ pt.) Available Help at Discharge: Family;Available 24 hours/day Type of Home: House Home Access: Level entry       Home Layout: One level Home Equipment: Rollator (4 wheels);BSC/3in1      Prior Function  Prior Level of Function : Needs assist;History of Falls (last six months)       Physical Assist : Mobility (physical);ADLs (physical) Mobility (physical): Bed mobility;Transfers;Gait ADLs (physical): Bathing;Dressing;Toileting Mobility Comments: brother in law assists to walk to BR for BM, uses BSC O/W, gets assistance for  bath, reports loss of mobility since fall and fracture.       Hand Dominance   Dominant Hand: Right    Extremity/Trunk Assessment   Upper Extremity Assessment Upper Extremity Assessment:  (noted tremors, L forarm/wrist cast)    Lower Extremity Assessment Lower Extremity Assessment: Generalized weakness;RLE deficits/detail;LLE deficits/detail RLE Deficits / Details: bears more weight than on left, LLE Deficits / Details: noted tremors of Leg when standing    Cervical / Trunk Assessment Cervical / Trunk Assessment: Kyphotic  Communication   Communication: Expressive difficulties (at times difficulty in completing  thoughts)  Cognition Arousal/Alertness: Awake/alert Behavior During Therapy: Flat affect Overall Cognitive Status: No family/caregiver present to determine baseline cognitive functioning Area of Impairment: Orientation                 Orientation Level: Time             General Comments: reports broke wrist 10/3, able to describe  that she is basically bedbound except to amb with assistance to BR and with HHPT(? # visits)        General Comments      Exercises     Assessment/Plan    PT Assessment Patient needs continued PT services  PT Problem List Decreased strength;Decreased balance;Decreased cognition;Decreased mobility;Decreased knowledge of use of DME;Decreased activity tolerance;Pain       PT Treatment Interventions DME instruction;Therapeutic activities;Gait training;Therapeutic exercise;Patient/family education;Balance training;Functional mobility training    PT Goals (Current goals can be found in the Care Plan  section)  Acute Rehab PT Goals Patient Stated Goal: go home today PT Goal Formulation: With patient Time For Goal Achievement: 03/22/21 Potential to Achieve Goals: Fair    Frequency Min 2X/week   Barriers to discharge Decreased caregiver support      Co-evaluation PT/OT/SLP Co-Evaluation/Treatment: Yes Reason for Co-Treatment: To address functional/ADL transfers PT goals addressed during session: Mobility/safety with mobility OT goals addressed during session: ADL's and self-care       AM-PAC PT "6 Clicks" Mobility  Outcome Measure Help needed turning from your back to your side while in a flat bed without using bedrails?: A Lot Help needed moving from lying on your back to sitting on the side of a flat bed without using bedrails?: A Lot Help needed moving to and from a bed to a chair (including a wheelchair)?: A Lot Help needed standing up from a chair using your arms (e.g., wheelchair or bedside chair)?: A Lot Help needed to walk in hospital room?: Total Help needed climbing 3-5 steps with a railing? : Total 6 Click Score: 10    End of Session   Activity Tolerance: Patient limited by fatigue Patient left: in bed;with call bell/phone within reach;with nursing/sitter in room Nurse Communication: Mobility status PT Visit Diagnosis: Unsteadiness on feet (R26.81);Difficulty in walking, not elsewhere classified (R26.2);Pain Pain - Right/Left: Left Pain - part of body: Hand    Time: 7169-6789 PT Time Calculation (min) (ACUTE  ONLY): 20 min   Charges:   PT Evaluation $PT Eval Low Complexity: 1 Low          Blanchard Kelch PT Acute Rehabilitation Services Pager [phone] Office [phone]   Rada Hay 03/08/2021, 10:16 AM

## 2021-03-08 NOTE — Progress Notes (Signed)
1 PROGRESS NOTE    Kiara Jones  SPQ:[id number] DOB: [birth year] DOA: 03/07/2021 PCP: Carmel Sacramento, NP   Chief Complain: Shortness of breath, cough  Brief Narrative: Patient is a 85 year old female with history of COPD, hyperlipidemia, hypertension, hypothyroidism, lymphedema, restless leg syndrome, spinal stenosis who presented from home with complaints of shortness of breath, cough.  She lives with her brother.  She recently had a fracture of her left wrist after she fell at home.  She was complaining of progressive shortness of breath and cough for last 3 to 4 days.  She also reported mild fever at home along with increased urgency, frequency of urine, dysuria.  She was found to be hypoxic on room air and had to be put on 2 L of oxygen.  Lab work showed mild leukocytosis.  Flu, COVID screen test negative.  Chest x-ray did not show pneumonia.  Patient was admitted for the management of COPD exacerbation and possible UTI.  Assessment & Plan:   Principal Problem:   COPD exacerbation (HCC) Active Problems:   Hypothyroidism   RLS (restless legs syndrome)   Spinal stenosis of lumbar region   Hyperlipidemia   Hypertension   Acute respiratory failure with hypoxia (HCC)   COPD exacerbation: Has longstanding history of COPD.  Takes inhalers at home.  Not on oxygen at home.  Presented with shortness of breath, cough.  Hypoxia on arrival with wheezing.  Chest x-ray did not show pneumonia.  Started on steroids, now changed to oral.  Continue bronchodilators as needed.  This morning she was on room air.  She is free of wheezing today.  Suspected UTI: UA suggestive  of UTI.  She also complained of increased frequency, dysuria.  Urine culture has been sent.  Started on ceftriaxone 1 g daily.  Hypothyroidism: Continue Synthyroid  Hyperlipidemia: Continue statin  Hypertension: Currently BP stable.  Takes amlodipine, hydrochlorothiazide, losartan  Neuropathy: On gabapentin  Anxiety/depression:  On BuSpar, Lexapro, temazepam  Chronic back pain/spinal stenosis/recent fall/recent left wrist fracture: Has cast on the left wrist.  She follows with physical therapy at home.  PT/OT recommending skilled stability on discharge.  She is interested in home health.  TOC referral made.         DVT prophylaxis:Lovenox Code Status: Full Family Communication:: Discussed with brother on phone on 11/17 Status is: Observation   Consultants: None  Procedures: None  Antimicrobials:  Anti-infectives (From admission, onward)    Start     Dose/Rate Route Frequency Ordered Stop   03/08/21 1100  cefTRIAXone (ROCEPHIN) 1 g in sodium chloride 0.9 % 100 mL IVPB        1 g 200 mL/hr over 30 Minutes Intravenous Every 24 hours 03/08/21 1001     03/07/21 1945  doxycycline (VIBRA-TABS) tablet 100 mg        100 mg Oral  Once 03/07/21 1939 03/07/21 2023       Subjective:  Patient seen and examined at the bedside in the emergency department.  During my evaluation, she was hemodynamically stable, on room air.  She was not in apparent distress.  Looks very deconditioned.  She was about to work with physical therapy.  Objective: Vitals:   03/08/21 0852 03/08/21 0900 03/08/21 1000 03/08/21 1045  BP: (!) 143/74 (!) 149/72 125/84   Pulse:  83 94 90  Resp:  14    Temp:      TempSrc:      SpO2:  96% 90% 94%  Weight:  Height:       No intake or output data in the 24 hours ending 03/08/21 1124 Filed Weights   03/07/21 1348  Weight: 45.8 kg    Examination:  General exam: Overall comfortable, not in distress, chronically ill looking, weak, deconditioned HEENT: PERRL Respiratory system:  no wheezes or crackles  Cardiovascular system: S1 & S2 heard, RRR.  Gastrointestinal system: Abdomen is nondistended, soft and nontender. Central nervous system: Alert and oriented Extremities: No edema, no clubbing ,no cyanosis Skin: No rashes, no ulcers,no icterus      Data Reviewed: I have  personally reviewed following labs and imaging studies  CBC: Recent Labs  Lab 03/07/21 1421 03/08/21 0527  WBC 12.9* 10.2  NEUTROABS 10.5*  --   HGB 13.7 13.3  HCT 42.3 40.6  MCV 92.2 92.1  PLT 230 198   Basic Metabolic Panel: Recent Labs  Lab 03/07/21 1421  NA 135  K 3.7  CL 96*  CO2 30  GLUCOSE 99  BUN 18  CREATININE 0.75  CALCIUM 9.8   GFR: Estimated Creatinine Clearance: 32.5 mL/min (by C-G formula based on SCr of 0.75 mg/dL). Liver Function Tests: No results for input(s): AST, ALT, ALKPHOS, BILITOT, PROT, ALBUMIN in the last 168 hours. No results for input(s): LIPASE, AMYLASE in the last 168 hours. No results for input(s): AMMONIA in the last 168 hours. Coagulation Profile: No results for input(s): INR, PROTIME in the last 168 hours. Cardiac Enzymes: No results for input(s): CKTOTAL, CKMB, CKMBINDEX, TROPONINI in the last 168 hours. BNP (last 3 results) No results for input(s): PROBNP in the last 8760 hours. HbA1C: No results for input(s): HGBA1C in the last 72 hours. CBG: No results for input(s): GLUCAP in the last 168 hours. Lipid Profile: No results for input(s): CHOL, HDL, LDLCALC, TRIG, CHOLHDL, LDLDIRECT in the last 72 hours. Thyroid Function Tests: No results for input(s): TSH, T4TOTAL, FREET4, T3FREE, THYROIDAB in the last 72 hours. Anemia Panel: No results for input(s): VITAMINB12, FOLATE, FERRITIN, TIBC, IRON, RETICCTPCT in the last 72 hours. Sepsis Labs: No results for input(s): PROCALCITON, LATICACIDVEN in the last 168 hours.  Recent Results (from the past 240 hour(s))  Resp Panel by RT-PCR (Flu A&B, Covid) Nasopharyngeal Swab     Status: None   Collection Time: 03/07/21  2:01 PM   Specimen: Nasopharyngeal Swab; Nasopharyngeal(NP) swabs in vial transport medium  Result Value Ref Range Status   SARS Coronavirus 2 by RT PCR NEGATIVE NEGATIVE Final    Comment: (NOTE) SARS-CoV-2 target nucleic acids are NOT DETECTED.  The SARS-CoV-2 RNA is  generally detectable in upper respiratory specimens during the acute phase of infection. The lowest concentration of SARS-CoV-2 viral copies this assay can detect is 138 copies/mL. A negative result does not preclude SARS-Cov-2 infection and should not be used as the sole basis for treatment or other patient management decisions. A negative result may occur with  improper specimen collection/handling, submission of specimen other than nasopharyngeal swab, presence of viral mutation(s) within the areas targeted by this assay, and inadequate number of viral copies(<138 copies/mL). A negative result must be combined with clinical observations, patient history, and epidemiological information. The expected result is Negative.  Fact Sheet for Patients:  BloggerCourse.com  Fact Sheet for Healthcare Providers:  SeriousBroker.it  This test is no t yet approved or cleared by the [place] FDA and  has been authorized for detection and/or diagnosis of SARS-CoV-2 by FDA under an Emergency Use Authorization (EUA). This EUA will remain  in  effect (meaning this test can be used) for the duration of the COVID-19 declaration under Section 564(b)(1) of the Act, 21 U.S.C.section 360bbb-3(b)(1), unless the authorization is terminated  or revoked sooner.       Influenza A by PCR NEGATIVE NEGATIVE Final   Influenza B by PCR NEGATIVE NEGATIVE Final    Comment: (NOTE) The Xpert Xpress SARS-CoV-2/FLU/RSV plus assay is intended as an aid in the diagnosis of influenza from Nasopharyngeal swab specimens and should not be used as a sole basis for treatment. Nasal washings and aspirates are unacceptable for Xpert Xpress SARS-CoV-2/FLU/RSV testing.  Fact Sheet for Patients: BloggerCourse.com  Fact Sheet for Healthcare Providers: SeriousBroker.it  This test is not yet approved or cleared by the [place] FDA and has been authorized for detection and/or diagnosis of SARS-CoV-2 by FDA under an Emergency Use Authorization (EUA). This EUA will remain in effect (meaning this test can be used) for the duration of the COVID-19 declaration under Section 564(b)(1) of the Act, 21 U.S.C. section 360bbb-3(b)(1), unless the authorization is terminated or revoked.  Performed at [facility], [address] [place], [place] [address]   Respiratory (~20 pathogens) panel by PCR     Status: None   Collection Time: 03/08/21  6:07 AM   Specimen: Nasopharyngeal Swab; Respiratory  Result Value Ref Range Status   Adenovirus NOT DETECTED NOT DETECTED Final   Coronavirus 229E NOT DETECTED NOT DETECTED Final    Comment: (NOTE) The Coronavirus on the Respiratory Panel, DOES NOT test for the novel  Coronavirus (2019 nCoV)    Coronavirus HKU1 NOT DETECTED NOT DETECTED Final   Coronavirus NL63 NOT DETECTED NOT DETECTED Final   Coronavirus OC43 NOT DETECTED NOT DETECTED Final   Metapneumovirus NOT DETECTED NOT DETECTED Final   Rhinovirus / Enterovirus NOT DETECTED NOT DETECTED Final   Influenza A NOT DETECTED NOT DETECTED Final   Influenza B NOT DETECTED NOT DETECTED Final   Parainfluenza Virus 1 NOT DETECTED NOT DETECTED Final   Parainfluenza Virus 2 NOT DETECTED NOT DETECTED Final   Parainfluenza Virus 3 NOT DETECTED NOT DETECTED Final   Parainfluenza Virus 4 NOT DETECTED NOT DETECTED Final   Respiratory Syncytial Virus NOT DETECTED NOT DETECTED Final   Bordetella pertussis NOT DETECTED NOT DETECTED Final   Bordetella Parapertussis NOT DETECTED NOT DETECTED Final   Chlamydophila pneumoniae NOT DETECTED NOT DETECTED Final   Mycoplasma pneumoniae NOT DETECTED NOT DETECTED Final    Comment: Performed at [facility], [address] [place], [place] [address]         Radiology Studies: DG Chest Port 1 View  Result Date: 03/07/2021 CLINICAL DATA:  Shortness of breath,  cough. EXAM: PORTABLE CHEST 1 VIEW COMPARISON:  January 22, 2021. FINDINGS: The heart size and mediastinal contours are within normal limits. Both lungs are clear. The visualized skeletal structures are unremarkable. IMPRESSION: No active disease. Aortic Atherosclerosis (ICD10-I70.0). Electronically Signed   By: Lupita Raider M.D.   On: 03/07/2021 14:14        Scheduled Meds:  amLODipine  2.5 mg Oral BID   busPIRone  15 mg Oral BID   Carbidopa-Levodopa ER  2 tablet Oral Daily   enoxaparin (LOVENOX) injection  40 mg Subcutaneous Q24H   escitalopram  2.5 mg Oral Daily   fluticasone furoate-vilanterol  1 puff Inhalation Daily   gabapentin  100 mg Oral TID   hydrochlorothiazide  12.5 mg Oral Daily   ipratropium-albuterol  3 mL Nebulization TID  levothyroxine  75 mcg Oral Q0600   losartan  100 mg Oral Daily   morphine  30 mg Oral BID   pravastatin  80 mg Oral Daily   predniSONE  40 mg Oral Q breakfast   sodium chloride flush  3 mL Intravenous Q12H   umeclidinium bromide  1 puff Inhalation Daily   Continuous Infusions:  cefTRIAXone (ROCEPHIN)  IV 1 g (03/08/21 1107)     LOS: 0 days    Time spent:35 mins. More than 50% of that time was spent in counseling and/or coordination of care.      Burnadette Pop, MD [facility] P11/17/2022, 11:24 AM

## 2021-03-09 MED ORDER — ONDANSETRON HCL 4 MG/2ML IJ SOLN
4.0000 mg | Freq: Four times a day (QID) | INTRAMUSCULAR | Status: DC | PRN
  Administered 2021-03-09: 4 mg via INTRAVENOUS
  Filled 2021-03-09: qty 2

## 2021-03-09 NOTE — Plan of Care (Signed)

## 2021-03-09 NOTE — TOC Initial Note (Addendum)
Transition of Care [facility]) - Initial/Assessment Note    Patient Details  Name: Kiara Jones MRN: [id number] Date of Birth: 1935/05/29  [facility] CM/SW Contact:    Ida Rogue, LCSW Phone Number: 03/09/2021, 10:32 AM  Clinical Narrative:    Patient seen in follow up to PT/OT recommendation of SNF.  [name] and her brother-in-law live together here in [place]. She admits that she has basically been staying in bed since she fell and broke her arm for fear of another fall, and  reluctantly agrees to allow me to send her information out for SNF bed search, wants to include her b-in-law in the decision/choice.  Bed search initiated.  TOC will continue to follow during the course of hospitalization.  AddendumMicah Flesher over bed offers with patient and b-in-law.  They choose Kiara Jones, but Kiara Jones wanted more information.  I called Kiara Jones at [facility] who told me she would reach out to him. She is checking to see if they can do an admission over the weekend.              Expected Discharge Plan: Skilled Nursing Facility Barriers to Discharge: SNF Pending bed offer   Patient Goals and CMS Choice     Choice offered to / list presented to : Patient, Sibling  Expected Discharge Plan and Services Expected Discharge Plan: Skilled Nursing Facility   Discharge Planning Services: CM Consult Post Acute Care Choice: Skilled Nursing Facility Living arrangements for the past 2 months: Single Family Home                                      Prior Living Arrangements/Services Living arrangements for the past 2 months: Single Family Home Lives with:: Relatives Patient language and need for interpreter reviewed:: Yes        Need for Family Participation in Patient Care: Yes (Comment) Care giver support system in place?: Yes (comment) Current home services: DME, Home PT Criminal Activity/Legal Involvement Pertinent to Current Situation/Hospitalization: No - Comment as  needed  Activities of Daily Living Home Assistive Devices/Equipment: Eyeglasses, Environmental consultant (specify type) ADL Screening (condition at time of admission) Patient's cognitive ability adequate to safely complete daily activities?: Yes Is the patient deaf or have difficulty hearing?: No Does the patient have difficulty seeing, even when wearing glasses/contacts?: No Does the patient have difficulty concentrating, remembering, or making decisions?: No Patient able to express need for assistance with ADLs?: Yes Does the patient have difficulty dressing or bathing?: Yes (secondary to weakness) Independently performs ADLs?: No (secondary to weakness) Communication: Independent Dressing (OT): Needs assistance Is this a change from baseline?: Change from baseline, expected to last >3 days Grooming: Needs assistance Is this a change from baseline?: Change from baseline, expected to last >3 days Feeding: Needs assistance Is this a change from baseline?: Change from baseline, expected to last >3 days Bathing: Needs assistance Is this a change from baseline?: Change from baseline, expected to last >3 days Toileting: Needs assistance Is this a change from baseline?: Change from baseline, expected to last >3days In/Out Bed: Needs assistance Is this a change from baseline?: Change from baseline, expected to last >3 days Walks in Home: Needs assistance Is this a change from baseline?: Change from baseline, expected to last >3 days Does the patient have difficulty walking or climbing stairs?: Yes (secondary to weakness) Weakness of Legs: Both Weakness of Arms/Hands:  None  Permission Sought/Granted Permission sought to share information with : Family Supports Permission granted to share information with : Yes, Verbal Permission Granted  Share Information with NAME: Kiara Jones (Brother)   [phone]           Emotional Assessment Appearance:: Appears stated age Attitude/Demeanor/Rapport:  Engaged Affect (typically observed): Appropriate Orientation: : Oriented to Self, Oriented to Place, Oriented to Situation Alcohol / Substance Use: Not Applicable Psych Involvement: No (comment)  Admission diagnosis:  COPD exacerbation ([place]) [J44.1] Patient Active Problem List   Diagnosis Date Noted   COPD exacerbation ([place]) 03/07/2021   Constipation 03/07/2021   Acute respiratory failure with hypoxia ([place]) 03/07/2021   Hypertension 10/09/2020   Hyperlipidemia 10/12/2019   Allergic rhinitis 04/13/2019   Chronic obstructive pulmonary disease ([place]) 04/13/2019   Hypothyroidism 04/13/2019   Osteoporosis 04/13/2019   RLS (restless legs syndrome) 04/13/2019   Chronic ischemic heart disease 04/13/2019   Spondylosis 04/13/2019   Colitis 04/13/2019   Spinal stenosis of lumbar region 04/13/2019   Peripheral neuropathy 04/13/2019   Lymphedema of leg 04/13/2019   PCP:  Emelia Loron, NP Pharmacy:   [facility] [id number] - [place], [place] [address] Phone: [phone] [phone]     Social Determinants of Health (SDOH) Interventions    Readmission Risk Interventions No flowsheet data found.

## 2021-03-09 NOTE — Progress Notes (Signed)
1 PROGRESS NOTE    Kiara Jones  JHE:[id number] DOB: [birth year] DOA: 03/07/2021 PCP: Carmel Sacramento, NP   Chief Complain: Shortness of breath, cough  Brief Narrative: Patient is a 85 year old female with history of COPD, hyperlipidemia, hypertension, hypothyroidism, lymphedema, restless leg syndrome, spinal stenosis who presented from home with complaints of shortness of breath, cough.  She lives with her brother.  She recently had a fracture of her left wrist after she fell at home.  She was complaining of progressive shortness of breath and cough for last 3 to 4 days.  She also reported mild fever at home along with increased urgency, frequency of urine, dysuria.  She was found to be hypoxic on room air and had to be put on 2 L of oxygen.  Lab work showed mild leukocytosis.  Flu, COVID screen test negative.  Chest x-ray did not show pneumonia.  Patient was admitted for the management of COPD exacerbation and possible UTI.  Assessment & Plan:   Principal Problem:   COPD exacerbation (HCC) Active Problems:   Hypothyroidism   RLS (restless legs syndrome)   Spinal stenosis of lumbar region   Hyperlipidemia   Hypertension   Acute respiratory failure with hypoxia (HCC)   COPD exacerbation: Has longstanding history of COPD.  Takes inhalers at home.  Not on oxygen at home.  Presented with shortness of breath, cough.  Hypoxia on arrival with wheezing.  Chest x-ray did not show pneumonia.  Started on steroids, now changed to oral.  Continue bronchodilators as needed.  This morning she was on room air.  Wheezings have improved.  Suspected UTI: UA suggestive  of UTI.  She also complained of increased frequency, dysuria.  Urine culture has been sent, will be followed.  Currently on ceftriaxone 1 g daily.  Hypothyroidism: Continue Synthyroid  Hyperlipidemia: Continue statin  Hypertension: Currently BP stable.  Takes amlodipine, hydrochlorothiazide, losartan  Neuropathy: On  gabapentin  Anxiety/depression: On BuSpar, Lexapro, temazepam  Chronic back pain/spinal stenosis/recent fall/recent left wrist fracture: Has cast on the left wrist.  She follows with physical therapy at home.  PT/OT recommending skilled stability on discharge.   TOC referral made.         DVT prophylaxis:Lovenox Code Status: Full Family Communication:: Discussed with brother on phone on 11/18 Status is: Inpatient   Consultants: None  Procedures: None  Antimicrobials:  Anti-infectives (From admission, onward)    Start     Dose/Rate Route Frequency Ordered Stop   03/08/21 1100  cefTRIAXone (ROCEPHIN) 1 g in sodium chloride 0.9 % 100 mL IVPB        1 g 200 mL/hr over 30 Minutes Intravenous Every 24 hours 03/08/21 1001     03/07/21 1945  doxycycline (VIBRA-TABS) tablet 100 mg        100 mg Oral  Once 03/07/21 1939 03/07/21 2023       Subjective:  Patient seen and examined the bedside this morning.  Hemodynamically stable and comfortable.  Denies any new complaints today.  Looks better today.  Objective: Vitals:   03/08/21 1623 03/08/21 2034 03/09/21 0018 03/09/21 0431  BP: (!) 138/57 (!) 140/91 140/73 132/85  Pulse: 93 (!) 102 (!) 101 93  Resp: 18 18 18 18   Temp: 98.1 F (36.7 C) 97.8 F (36.6 C) 98.1 F (36.7 C) 98.4 F (36.9 C)  TempSrc: Oral Oral Oral   SpO2: 97% 93% 92% 94%  Weight:      Height:        Intake/Output Summary (  Last 24 hours) at 03/09/2021 0746 Last data filed at 03/09/2021 0400 Gross per 24 hour  Intake 100 ml  Output 500 ml  Net -400 ml   Filed Weights   03/07/21 1348  Weight: 45.8 kg    Examination:  General exam: Overall comfortable, not in distress, weak, deconditioned, chronically ill looking HEENT: PERRL Respiratory system: Faint expiratory wheezing bilaterally Cardiovascular system: S1 & S2 heard, RRR.  Gastrointestinal system: Abdomen is nondistended, soft and nontender. Central nervous system: Alert and  oriented Extremities: No edema, no clubbing ,no cyanosis, cast on her left forearm Skin: No rashes, no ulcers,no icterus        Data Reviewed: I have personally reviewed following labs and imaging studies  CBC: Recent Labs  Lab 03/07/21 1421 03/08/21 0527  WBC 12.9* 10.2  NEUTROABS 10.5*  --   HGB 13.7 13.3  HCT 42.3 40.6  MCV 92.2 92.1  PLT 230 198   Basic Metabolic Panel: Recent Labs  Lab 03/07/21 1421  NA 135  K 3.7  CL 96*  CO2 30  GLUCOSE 99  BUN 18  CREATININE 0.75  CALCIUM 9.8   GFR: Estimated Creatinine Clearance: 32.5 mL/min (by C-G formula based on SCr of 0.75 mg/dL). Liver Function Tests: No results for input(s): AST, ALT, ALKPHOS, BILITOT, PROT, ALBUMIN in the last 168 hours. No results for input(s): LIPASE, AMYLASE in the last 168 hours. No results for input(s): AMMONIA in the last 168 hours. Coagulation Profile: No results for input(s): INR, PROTIME in the last 168 hours. Cardiac Enzymes: No results for input(s): CKTOTAL, CKMB, CKMBINDEX, TROPONINI in the last 168 hours. BNP (last 3 results) No results for input(s): PROBNP in the last 8760 hours. HbA1C: No results for input(s): HGBA1C in the last 72 hours. CBG: No results for input(s): GLUCAP in the last 168 hours. Lipid Profile: No results for input(s): CHOL, HDL, LDLCALC, TRIG, CHOLHDL, LDLDIRECT in the last 72 hours. Thyroid Function Tests: No results for input(s): TSH, T4TOTAL, FREET4, T3FREE, THYROIDAB in the last 72 hours. Anemia Panel: No results for input(s): VITAMINB12, FOLATE, FERRITIN, TIBC, IRON, RETICCTPCT in the last 72 hours. Sepsis Labs: No results for input(s): PROCALCITON, LATICACIDVEN in the last 168 hours.  Recent Results (from the past 240 hour(s))  Resp Panel by RT-PCR (Flu A&B, Covid) Nasopharyngeal Swab     Status: None   Collection Time: 03/07/21  2:01 PM   Specimen: Nasopharyngeal Swab; Nasopharyngeal(NP) swabs in vial transport medium  Result Value Ref Range Status    SARS Coronavirus 2 by RT PCR NEGATIVE NEGATIVE Final    Comment: (NOTE) SARS-CoV-2 target nucleic acids are NOT DETECTED.  The SARS-CoV-2 RNA is generally detectable in upper respiratory specimens during the acute phase of infection. The lowest concentration of SARS-CoV-2 viral copies this assay can detect is 138 copies/mL. A negative result does not preclude SARS-Cov-2 infection and should not be used as the sole basis for treatment or other patient management decisions. A negative result may occur with  improper specimen collection/handling, submission of specimen other than nasopharyngeal swab, presence of viral mutation(s) within the areas targeted by this assay, and inadequate number of viral copies(<138 copies/mL). A negative result must be combined with clinical observations, patient history, and epidemiological information. The expected result is Negative.  Fact Sheet for Patients:  BloggerCourse.com  Fact Sheet for Healthcare Providers:  SeriousBroker.it  This test is no t yet approved or cleared by the [place] FDA and  has been authorized for detection and/or diagnosis  of SARS-CoV-2 by FDA under an Emergency Use Authorization (EUA). This EUA will remain  in effect (meaning this test can be used) for the duration of the COVID-19 declaration under Section 564(b)(1) of the Act, 21 U.S.C.section 360bbb-3(b)(1), unless the authorization is terminated  or revoked sooner.       Influenza A by PCR NEGATIVE NEGATIVE Final   Influenza B by PCR NEGATIVE NEGATIVE Final    Comment: (NOTE) The Xpert Xpress SARS-CoV-2/FLU/RSV plus assay is intended as an aid in the diagnosis of influenza from Nasopharyngeal swab specimens and should not be used as a sole basis for treatment. Nasal washings and aspirates are unacceptable for Xpert Xpress SARS-CoV-2/FLU/RSV testing.  Fact Sheet for  Patients: BloggerCourse.com  Fact Sheet for Healthcare Providers: SeriousBroker.it  This test is not yet approved or cleared by the [place] FDA and has been authorized for detection and/or diagnosis of SARS-CoV-2 by FDA under an Emergency Use Authorization (EUA). This EUA will remain in effect (meaning this test can be used) for the duration of the COVID-19 declaration under Section 564(b)(1) of the Act, 21 U.S.C. section 360bbb-3(b)(1), unless the authorization is terminated or revoked.  Performed at [facility], [address] [place], [place] [address]   Respiratory (~20 pathogens) panel by PCR     Status: None   Collection Time: 03/08/21  6:07 AM   Specimen: Nasopharyngeal Swab; Respiratory  Result Value Ref Range Status   Adenovirus NOT DETECTED NOT DETECTED Final   Coronavirus 229E NOT DETECTED NOT DETECTED Final    Comment: (NOTE) The Coronavirus on the Respiratory Panel, DOES NOT test for the novel  Coronavirus (2019 nCoV)    Coronavirus HKU1 NOT DETECTED NOT DETECTED Final   Coronavirus NL63 NOT DETECTED NOT DETECTED Final   Coronavirus OC43 NOT DETECTED NOT DETECTED Final   Metapneumovirus NOT DETECTED NOT DETECTED Final   Rhinovirus / Enterovirus NOT DETECTED NOT DETECTED Final   Influenza A NOT DETECTED NOT DETECTED Final   Influenza B NOT DETECTED NOT DETECTED Final   Parainfluenza Virus 1 NOT DETECTED NOT DETECTED Final   Parainfluenza Virus 2 NOT DETECTED NOT DETECTED Final   Parainfluenza Virus 3 NOT DETECTED NOT DETECTED Final   Parainfluenza Virus 4 NOT DETECTED NOT DETECTED Final   Respiratory Syncytial Virus NOT DETECTED NOT DETECTED Final   Bordetella pertussis NOT DETECTED NOT DETECTED Final   Bordetella Parapertussis NOT DETECTED NOT DETECTED Final   Chlamydophila pneumoniae NOT DETECTED NOT DETECTED Final   Mycoplasma pneumoniae NOT DETECTED NOT DETECTED Final    Comment:  Performed at [facility], [address], [place], [place] [address]         Radiology Studies: DG Chest Port 1 View  Result Date: 03/07/2021 CLINICAL DATA:  Shortness of breath, cough. EXAM: PORTABLE CHEST 1 VIEW COMPARISON:  January 22, 2021. FINDINGS: The heart size and mediastinal contours are within normal limits. Both lungs are clear. The visualized skeletal structures are unremarkable. IMPRESSION: No active disease. Aortic Atherosclerosis (ICD10-I70.0). Electronically Signed   By: Lupita Raider M.D.   On: 03/07/2021 14:14        Scheduled Meds:  amLODipine  2.5 mg Oral BID   busPIRone  15 mg Oral BID   Carbidopa-Levodopa ER  2 tablet Oral Daily   enoxaparin (LOVENOX) injection  40 mg Subcutaneous Q24H   escitalopram  2.5 mg Oral Daily   fluticasone furoate-vilanterol  1 puff Inhalation Daily   gabapentin  100 mg Oral TID  hydrochlorothiazide  12.5 mg Oral Daily   ipratropium-albuterol  3 mL Nebulization TID   levothyroxine  75 mcg Oral Q0600   losartan  100 mg Oral Daily   morphine  30 mg Oral BID   pravastatin  80 mg Oral Daily   predniSONE  40 mg Oral Q breakfast   sodium chloride flush  3 mL Intravenous Q12H   umeclidinium bromide  1 puff Inhalation Daily   Continuous Infusions:  cefTRIAXone (ROCEPHIN)  IV Stopped (03/08/21 1137)     LOS: 1 day    Time spent:25 mins. More than 50% of that time was spent in counseling and/or coordination of care.      Burnadette Pop, MD [facility] P11/18/2022, 7:46 AM

## 2021-03-09 NOTE — [place] FL2 (Signed)
[place] MEDICAID FL2 LEVEL OF CARE SCREENING TOOL     IDENTIFICATION  Patient Name: Kiara Jones Birthdate: 1936/03/29 Sex: female Admission Date (Current Location): 03/07/2021  [facility] and [place] Number:  Producer, television/film/video and Address:  [facility],  [address] [place], [place] [address]      Provider Number: [id number]  Attending Physician Name and Address:  Burnadette Pop, MD  Relative Name and Phone Number:  Joette Catching   [phone]    Current Level of Care: Hospital Recommended Level of Care: Skilled Nursing Facility Prior Approval Number:    Date Approved/Denied:   PASRR Number: [id number]  Discharge Plan: SNF    Current Diagnoses: Patient Active Problem List   Diagnosis Date Noted   COPD exacerbation (HCC) 03/07/2021   Constipation 03/07/2021   Acute respiratory failure with hypoxia (HCC) 03/07/2021   Hypertension 10/09/2020   Hyperlipidemia 10/12/2019   Allergic rhinitis 04/13/2019   Chronic obstructive pulmonary disease (HCC) 04/13/2019   Hypothyroidism 04/13/2019   Osteoporosis 04/13/2019   RLS (restless legs syndrome) 04/13/2019   Chronic ischemic heart disease 04/13/2019   Spondylosis 04/13/2019   Colitis 04/13/2019   Spinal stenosis of lumbar region 04/13/2019   Peripheral neuropathy 04/13/2019   Lymphedema of leg 04/13/2019    Orientation RESPIRATION BLADDER Height & Weight     Self, Situation, Place  Normal External catheter Weight: 45.8 kg Height:  4\' 9"  (144.8 cm)  BEHAVIORAL SYMPTOMS/MOOD NEUROLOGICAL BOWEL NUTRITION STATUS      Continent Diet (see d/c summary)  AMBULATORY STATUS COMMUNICATION OF NEEDS Skin   Extensive Assist Verbally Other (Comment) (Wound, Moisture Associated, Buttocks, Medial)                       Personal Care Assistance Level of Assistance  Bathing, Feeding, Dressing Bathing Assistance: Maximum assistance Feeding assistance: Independent Dressing Assistance:  Maximum assistance     Functional Limitations Info  Sight, Hearing, Speech Sight Info: Adequate Hearing Info: Adequate Speech Info: Adequate    SPECIAL CARE FACTORS FREQUENCY  PT (By licensed PT), OT (By licensed OT)     PT Frequency: 5X/W OT Frequency: 5X/W            Contractures Contractures Info: Not present    Additional Factors Info  Code Status, Allergies Code Status Info: full Allergies Info: Lyrica (Pregabalin)   Alprazolam   Aspirin   Codeine   Nsaids   Penicillins   Sertraline   Tapentadol   Silicone   Sulfa Antibiotics   Tape           Current Medications (03/09/2021):  This is the current hospital active medication list Current Facility-Administered Medications  Medication Dose Route Frequency Provider Last Rate Last Admin   acetaminophen (TYLENOL) tablet 650 mg  650 mg Oral Q6H PRN 03/11/2021, MD       Or   acetaminophen (TYLENOL) suppository 650 mg  650 mg Rectal Q6H PRN Synetta Fail, MD       albuterol (PROVENTIL) (2.5 MG/3ML) 0.083% nebulizer solution 2.5 mg  2.5 mg Nebulization Q2H PRN Synetta Fail, MD       amLODipine (NORVASC) tablet 2.5 mg  2.5 mg Oral BID Synetta Fail, MD   2.5 mg at 03/08/21 2101   busPIRone (BUSPAR) tablet 15 mg  15 mg Oral BID 2102, MD   15 mg at 03/08/21 2103   Carbidopa-Levodopa ER (SINEMET CR) 25-100 MG tablet controlled release  2 tablet  2 tablet Oral Daily Marcelyn Bruins, MD   2 tablet at 03/08/21 0920   cefTRIAXone (ROCEPHIN) 1 g in sodium chloride 0.9 % 100 mL IVPB  1 g Intravenous Q24H Shelly Coss, MD   Stopped at 03/08/21 1137   enoxaparin (LOVENOX) injection 40 mg  40 mg Subcutaneous Q24H Marcelyn Bruins, MD   40 mg at 03/08/21 2103   escitalopram (LEXAPRO) tablet 2.5 mg  2.5 mg Oral Daily Marcelyn Bruins, MD   2.5 mg at 03/08/21 1000   fluticasone furoate-vilanterol (BREO ELLIPTA) 100-25 MCG/ACT 1 puff  1 puff Inhalation Daily Marcelyn Bruins, MD        gabapentin (NEURONTIN) capsule 100 mg  100 mg Oral TID Marcelyn Bruins, MD   100 mg at 03/08/21 2104   hydrochlorothiazide (HYDRODIURIL) tablet 12.5 mg  12.5 mg Oral Daily Marcelyn Bruins, MD   12.5 mg at 03/08/21 0900   ipratropium-albuterol (DUONEB) 0.5-2.5 (3) MG/3ML nebulizer solution 3 mL  3 mL Nebulization TID Marcelyn Bruins, MD   3 mL at 03/09/21 0917   levothyroxine (SYNTHROID) tablet 75 mcg  75 mcg Oral Q0600 Marcelyn Bruins, MD   75 mcg at 03/09/21 [id number]   losartan (COZAAR) tablet 100 mg  100 mg Oral Daily Marcelyn Bruins, MD   100 mg at 03/08/21 0900   morphine (MS CONTIN) 12 hr tablet 30 mg  30 mg Oral BID Marcelyn Bruins, MD   30 mg at 03/08/21 2101   oxyCODONE-acetaminophen (PERCOCET/ROXICET) 5-325 MG per tablet 1 tablet  1 tablet Oral Q8H PRN Marcelyn Bruins, MD   1 tablet at 03/08/21 2039   And   oxyCODONE (Oxy IR/ROXICODONE) immediate release tablet 5 mg  5 mg Oral Q8H PRN Marcelyn Bruins, MD   5 mg at 03/08/21 0013   polyethylene glycol (MIRALAX / GLYCOLAX) packet 17 g  17 g Oral Daily PRN Marcelyn Bruins, MD       pravastatin (PRAVACHOL) tablet 80 mg  80 mg Oral Daily Marcelyn Bruins, MD   80 mg at 03/08/21 0900   predniSONE (DELTASONE) tablet 40 mg  40 mg Oral Q breakfast Marcelyn Bruins, MD   40 mg at 03/09/21 0836   sodium chloride flush (NS) 0.9 % injection 3 mL  3 mL Intravenous Q12H Marcelyn Bruins, MD   3 mL at 03/08/21 2200   temazepam (RESTORIL) capsule 30 mg  30 mg Oral QHS PRN Marcelyn Bruins, MD   30 mg at 03/08/21 0021   umeclidinium bromide (INCRUSE ELLIPTA) 62.5 MCG/ACT 1 puff  1 puff Inhalation Daily Marcelyn Bruins, MD         Discharge Medications: Please see discharge summary for a list of discharge medications.  Relevant Imaging Results:  Relevant Lab Results:   Additional Information [place], [place]

## 2021-03-10 LAB — URINE CULTURE: Culture: >=100000 — AB

## 2021-03-10 MED ORDER — FOSFOMYCIN TROMETHAMINE 3 G PO PACK
3.0000 g | PACK | Freq: Once | ORAL | Status: AC
Start: 2021-03-10 — End: 2021-03-10
  Administered 2021-03-10: 3 g via ORAL
  Filled 2021-03-10: qty 3

## 2021-03-10 MED ORDER — IPRATROPIUM-ALBUTEROL 0.5-2.5 (3) MG/3ML IN SOLN
3.0000 mL | Freq: Four times a day (QID) | RESPIRATORY_TRACT | Status: DC
  Administered 2021-03-10 – 2021-03-13 (×13): 3 mL via RESPIRATORY_TRACT
  Filled 2021-03-10 (×14): qty 3

## 2021-03-10 NOTE — Progress Notes (Signed)
1 PROGRESS NOTE    Kiara Jones  MRA:[id number] DOB: [birth year] DOA: 03/07/2021 PCP: Carmel Sacramento, NP   Chief Complain: Shortness of breath, cough  Brief Narrative: Patient is a 85 year old female with history of COPD, hyperlipidemia, hypertension, hypothyroidism, lymphedema, restless leg syndrome, spinal stenosis who presented from home with complaints of shortness of breath, cough.  She lives with her brother.  She recently had a fracture of her left wrist after she fell at home.  She was complaining of progressive shortness of breath and cough for last 3 to 4 days.  She also reported mild fever at home along with increased urgency, frequency of urine, dysuria.  She was found to be hypoxic on room air and had to be put on 2 L of oxygen.  Lab work showed mild leukocytosis.  Flu, COVID screen test negative.  Chest x-ray did not show pneumonia.  Patient was admitted for the management of COPD exacerbation and possible UTI.  Respiratory status has significantly improved.  Currently she is stable for discharge to skilled nursing facility as soon as bed is available  Assessment & Plan:   Principal Problem:   COPD exacerbation (HCC) Active Problems:   Hypothyroidism   RLS (restless legs syndrome)   Spinal stenosis of lumbar region   Hyperlipidemia   Hypertension   Acute respiratory failure with hypoxia (HCC)   COPD exacerbation: Has longstanding history of COPD.  Takes inhalers at home.  Not on oxygen at home.  Presented with shortness of breath, cough.  Hypoxia on arrival with wheezing.  Chest x-ray did not show pneumonia.  Started on steroids, now changed to oral.  Continue bronchodilators as needed. She has been  on room air.  Wheezings have improved.  Suspected UTI: UA suggestive  of UTI.  Urine culture and also Enterococcus faecalis.  She was on ceftriaxone which has been discontinued now.  We will treat her with a dose of fosfomycin.  We will follow-up further sensitivity. she  complained of increased frequency, dysuria on admission.    Hypothyroidism: Continue Synthyroid  Hyperlipidemia: Continue statin  Hypertension: Currently BP stable.  Takes amlodipine, hydrochlorothiazide, losartan  Neuropathy: On gabapentin  Anxiety/depression: On BuSpar, Lexapro, temazepam  Chronic back pain/spinal stenosis/recent fall/recent left wrist fracture: Has cast on the left wrist.  She follows with physical therapy at home.  PT/OT recommending skilled stability on discharge.   TOC referral made.  Waiting for insurance authorization.         DVT prophylaxis:Lovenox Code Status: Full Family Communication:: Discussed with brother on phone on 11/18 Status is: Inpatient   Consultants: None  Procedures: None  Antimicrobials:  Anti-infectives (From admission, onward)    Start     Dose/Rate Route Frequency Ordered Stop   03/08/21 1100  cefTRIAXone (ROCEPHIN) 1 g in sodium chloride 0.9 % 100 mL IVPB        1 g 200 mL/hr over 30 Minutes Intravenous Every 24 hours 03/08/21 1001     03/07/21 1945  doxycycline (VIBRA-TABS) tablet 100 mg        100 mg Oral  Once 03/07/21 1939 03/07/21 2023       Subjective:  Patient seen and examined at the bedside this morning.  Hemodynamically stable, comfortable.  Denies any new complaints today  Objective: Vitals:   03/09/21 1124 03/09/21 1415 03/09/21 2006 03/10/21 0637  BP: (!) 153/69 (!) 146/82 129/70 134/68  Pulse: (!) 104 64 99 86  Resp:  16 12 20   Temp:  98.5 F (36.9  C) 98 F (36.7 C) 98.7 F (37.1 C)  TempSrc:  Oral Oral Oral  SpO2:  95% 100% (!) 89%  Weight:      Height:        Intake/Output Summary (Last 24 hours) at 03/10/2021 0734 Last data filed at 03/09/2021 1953 Gross per 24 hour  Intake 1062 ml  Output 925 ml  Net 137 ml   Filed Weights   03/07/21 1348  Weight: 45.8 kg    Examination:  General exam: Overall comfortable, not in distress, deconditioned, weak HEENT: PERRL Respiratory system:  Mildly diminished air sounds bilaterally, no wheezes or crackles  Cardiovascular system: S1 & S2 heard, RRR.  Gastrointestinal system: Abdomen is nondistended, soft and nontender. Central nervous system: Alert and oriented Extremities: No edema, no clubbing ,no cyanosis, cast on the left forearm Skin: No rashes, no ulcers,no icterus      Data Reviewed: I have personally reviewed following labs and imaging studies  CBC: Recent Labs  Lab 03/07/21 1421 03/08/21 0527  WBC 12.9* 10.2  NEUTROABS 10.5*  --   HGB 13.7 13.3  HCT 42.3 40.6  MCV 92.2 92.1  PLT 230 198   Basic Metabolic Panel: Recent Labs  Lab 03/07/21 1421  NA 135  K 3.7  CL 96*  CO2 30  GLUCOSE 99  BUN 18  CREATININE 0.75  CALCIUM 9.8   GFR: Estimated Creatinine Clearance: 32.5 mL/min (by C-G formula based on SCr of 0.75 mg/dL). Liver Function Tests: No results for input(s): AST, ALT, ALKPHOS, BILITOT, PROT, ALBUMIN in the last 168 hours. No results for input(s): LIPASE, AMYLASE in the last 168 hours. No results for input(s): AMMONIA in the last 168 hours. Coagulation Profile: No results for input(s): INR, PROTIME in the last 168 hours. Cardiac Enzymes: No results for input(s): CKTOTAL, CKMB, CKMBINDEX, TROPONINI in the last 168 hours. BNP (last 3 results) No results for input(s): PROBNP in the last 8760 hours. HbA1C: No results for input(s): HGBA1C in the last 72 hours. CBG: No results for input(s): GLUCAP in the last 168 hours. Lipid Profile: No results for input(s): CHOL, HDL, LDLCALC, TRIG, CHOLHDL, LDLDIRECT in the last 72 hours. Thyroid Function Tests: No results for input(s): TSH, T4TOTAL, FREET4, T3FREE, THYROIDAB in the last 72 hours. Anemia Panel: No results for input(s): VITAMINB12, FOLATE, FERRITIN, TIBC, IRON, RETICCTPCT in the last 72 hours. Sepsis Labs: No results for input(s): PROCALCITON, LATICACIDVEN in the last 168 hours.  Recent Results (from the past 240 hour(s))  Resp Panel by  RT-PCR (Flu A&B, Covid) Nasopharyngeal Swab     Status: None   Collection Time: 03/07/21  2:01 PM   Specimen: Nasopharyngeal Swab; Nasopharyngeal(NP) swabs in vial transport medium  Result Value Ref Range Status   SARS Coronavirus 2 by RT PCR NEGATIVE NEGATIVE Final    Comment: (NOTE) SARS-CoV-2 target nucleic acids are NOT DETECTED.  The SARS-CoV-2 RNA is generally detectable in upper respiratory specimens during the acute phase of infection. The lowest concentration of SARS-CoV-2 viral copies this assay can detect is 138 copies/mL. A negative result does not preclude SARS-Cov-2 infection and should not be used as the sole basis for treatment or other patient management decisions. A negative result may occur with  improper specimen collection/handling, submission of specimen other than nasopharyngeal swab, presence of viral mutation(s) within the areas targeted by this assay, and inadequate number of viral copies(<138 copies/mL). A negative result must be combined with clinical observations, patient history, and epidemiological information. The expected result is  Negative.  Fact Sheet for Patients:  BloggerCourse.com  Fact Sheet for Healthcare Providers:  SeriousBroker.it  This test is no t yet approved or cleared by the [place] FDA and  has been authorized for detection and/or diagnosis of SARS-CoV-2 by FDA under an Emergency Use Authorization (EUA). This EUA will remain  in effect (meaning this test can be used) for the duration of the COVID-19 declaration under Section 564(b)(1) of the Act, 21 U.S.C.section 360bbb-3(b)(1), unless the authorization is terminated  or revoked sooner.       Influenza A by PCR NEGATIVE NEGATIVE Final   Influenza B by PCR NEGATIVE NEGATIVE Final    Comment: (NOTE) The Xpert Xpress SARS-CoV-2/FLU/RSV plus assay is intended as an aid in the diagnosis of influenza from Nasopharyngeal swab  specimens and should not be used as a sole basis for treatment. Nasal washings and aspirates are unacceptable for Xpert Xpress SARS-CoV-2/FLU/RSV testing.  Fact Sheet for Patients: BloggerCourse.com  Fact Sheet for Healthcare Providers: SeriousBroker.it  This test is not yet approved or cleared by the [place] FDA and has been authorized for detection and/or diagnosis of SARS-CoV-2 by FDA under an Emergency Use Authorization (EUA). This EUA will remain in effect (meaning this test can be used) for the duration of the COVID-19 declaration under Section 564(b)(1) of the Act, 21 U.S.C. section 360bbb-3(b)(1), unless the authorization is terminated or revoked.  Performed at [facility], [address], [place], [place] [address]   Respiratory (~20 pathogens) panel by PCR     Status: None   Collection Time: 03/08/21  6:07 AM   Specimen: Nasopharyngeal Swab; Respiratory  Result Value Ref Range Status   Adenovirus NOT DETECTED NOT DETECTED Final   Coronavirus 229E NOT DETECTED NOT DETECTED Final    Comment: (NOTE) The Coronavirus on the Respiratory Panel, DOES NOT test for the novel  Coronavirus (2019 nCoV)    Coronavirus HKU1 NOT DETECTED NOT DETECTED Final   Coronavirus NL63 NOT DETECTED NOT DETECTED Final   Coronavirus OC43 NOT DETECTED NOT DETECTED Final   Metapneumovirus NOT DETECTED NOT DETECTED Final   Rhinovirus / Enterovirus NOT DETECTED NOT DETECTED Final   Influenza A NOT DETECTED NOT DETECTED Final   Influenza B NOT DETECTED NOT DETECTED Final   Parainfluenza Virus 1 NOT DETECTED NOT DETECTED Final   Parainfluenza Virus 2 NOT DETECTED NOT DETECTED Final   Parainfluenza Virus 3 NOT DETECTED NOT DETECTED Final   Parainfluenza Virus 4 NOT DETECTED NOT DETECTED Final   Respiratory Syncytial Virus NOT DETECTED NOT DETECTED Final   Bordetella pertussis NOT DETECTED NOT DETECTED Final   Bordetella  Parapertussis NOT DETECTED NOT DETECTED Final   Chlamydophila pneumoniae NOT DETECTED NOT DETECTED Final   Mycoplasma pneumoniae NOT DETECTED NOT DETECTED Final    Comment: Performed at [facility], [address], [place], [place] [address]  Urine Culture     Status: Abnormal (Preliminary result)   Collection Time: 03/08/21  9:32 AM   Specimen: Urine, Clean Catch  Result Value Ref Range Status   Specimen Description   Final    URINE, CLEAN CATCH Performed at [facility], [address], [place], [place] [address]    Special Requests   Final    NONE Performed at [facility], [address], [place], [place] [address]    Culture >=100,000 COLONIES/mL ENTEROCOCCUS FAECALIS (A)  Final   Report Status PENDING  Incomplete  Radiology Studies: No results found.      Scheduled Meds:  amLODipine  2.5 mg Oral BID   busPIRone  15 mg Oral BID   Carbidopa-Levodopa ER  2 tablet Oral Daily   enoxaparin (LOVENOX) injection  40 mg Subcutaneous Q24H   escitalopram  2.5 mg Oral Daily   fluticasone furoate-vilanterol  1 puff Inhalation Daily   gabapentin  100 mg Oral TID   hydrochlorothiazide  12.5 mg Oral Daily   ipratropium-albuterol  3 mL Nebulization TID   levothyroxine  75 mcg Oral Q0600   losartan  100 mg Oral Daily   morphine  30 mg Oral BID   pravastatin  80 mg Oral Daily   predniSONE  40 mg Oral Q breakfast   sodium chloride flush  3 mL Intravenous Q12H   umeclidinium bromide  1 puff Inhalation Daily   Continuous Infusions:  cefTRIAXone (ROCEPHIN)  IV 1 g (03/09/21 1151)     LOS: 2 days    Time spent:25 mins. More than 50% of that time was spent in counseling and/or coordination of care.      Burnadette Pop, MD [facility] P11/19/2022, 7:34 AM

## 2021-03-10 NOTE — TOC Progression Note (Signed)
Transition of Care [facility]) - Progression Note    Patient Details  Name: Kiara Jones MRN: [id number] Date of Birth: 03/13/1936  [facility] CM/SW Contact  Geni Bers, RN Phone Number: 03/10/2021, 11:54 AM  Clinical Narrative:    A call to SNF revealed they do not  take WE admissions. Spoke with [employer].    Expected Discharge Plan: Skilled Nursing Facility Barriers to Discharge: SNF Pending bed offer  Expected Discharge Plan and Services Expected Discharge Plan: Skilled Nursing Facility   Discharge Planning Services: CM Consult Post Acute Care Choice: Skilled Nursing Facility Living arrangements for the past 2 months: Single Family Home                                       Social Determinants of Health (SDOH) Interventions    Readmission Risk Interventions No flowsheet data found.

## 2021-03-11 NOTE — Progress Notes (Signed)
1 PROGRESS NOTE    Kiara Jones  ZOX:[id number] DOB: 1935/08/11 DOA: 03/07/2021 PCP: Carmel Sacramento, NP   Chief Complain: Shortness of breath, cough  Brief Narrative: Patient is a 85 year old female with history of COPD, hyperlipidemia, hypertension, hypothyroidism, lymphedema, restless leg syndrome, spinal stenosis who presented from home with complaints of shortness of breath, cough.  She lives with her brother.  She recently had a fracture of her left wrist after she fell at home.  She was complaining of progressive shortness of breath and cough for last 3 to 4 days.  She also reported mild fever at home along with increased urgency, frequency of urine, dysuria.  She was found to be hypoxic on room air and had to be put on 2 L of oxygen.  Lab work showed mild leukocytosis.  Flu, COVID screen test negative.  Chest x-ray did not show pneumonia.  Patient was admitted for the management of COPD exacerbation and possible UTI.  Respiratory status has significantly improved.  Currently she is stable for discharge to skilled nursing facility as soon as bed is available  Assessment & Plan:   Principal Problem:   COPD exacerbation (HCC) Active Problems:   Hypothyroidism   RLS (restless legs syndrome)   Spinal stenosis of lumbar region   Hyperlipidemia   Hypertension   Acute respiratory failure with hypoxia (HCC)   COPD exacerbation: Has longstanding history of COPD.  Takes inhalers at home.  Not on oxygen at home.  Presented with shortness of breath, cough.  Hypoxia on arrival with wheezing.  Chest x-ray did not show pneumonia.  Started on steroids, now changed to oral.  Continue bronchodilators as needed. She has been  on room air.  Wheezings have improved.  Suspected UTI: UA suggestive  of UTI.  Urine culture showed  Enterococcus faecalis.  She was on ceftriaxone which has been discontinued now.  We treated  her with a dose of fosfomycin.  She is allergic to penicillin .she complained of  increased frequency, dysuria on admission,now better   Hypothyroidism: Continue Synthyroid  Hyperlipidemia: Continue statin  Hypertension: Currently BP stable.  Takes amlodipine, hydrochlorothiazide, losartan  Neuropathy: On gabapentin  Anxiety/depression: On BuSpar, Lexapro, temazepam  Chronic back pain/spinal stenosis/recent fall/recent left wrist fracture: Has cast on the left wrist.  She follows with physical therapy at home.  PT/OT recommending skilled stability on discharge.   TOC referral made.  Waiting for insurance authorization.         DVT prophylaxis:Lovenox Code Status: Full Family Communication:: Discussed with brother on phone on 11/18 Status is: Inpatient   Consultants: None  Procedures: None  Antimicrobials:  Anti-infectives (From admission, onward)    Start     Dose/Rate Route Frequency Ordered Stop   03/10/21 0830  fosfomycin (MONUROL) packet 3 g        3 g Oral  Once 03/10/21 0736 03/10/21 0839   03/08/21 1100  cefTRIAXone (ROCEPHIN) 1 g in sodium chloride 0.9 % 100 mL IVPB  Status:  Discontinued        1 g 200 mL/hr over 30 Minutes Intravenous Every 24 hours 03/08/21 1001 03/10/21 0736   03/07/21 1945  doxycycline (VIBRA-TABS) tablet 100 mg        100 mg Oral  Once 03/07/21 1939 03/07/21 2023       Subjective:  Patient seen and examined the bedside this morning.  Hemodynamically stable and comfortable.  Denies new complaints today  Objective: Vitals:   03/10/21 1559 03/10/21 1928 03/10/21 2036  03/11/21 0730  BP:   117/75 (!) 145/83  Pulse:   95 76  Resp:   16 16  Temp:   97.8 F (36.6 C) 98 F (36.7 C)  TempSrc:   Oral Oral  SpO2: 95% 95% 98% 98%  Weight:      Height:        Intake/Output Summary (Last 24 hours) at 03/11/2021 0816 Last data filed at 03/10/2021 2123 Gross per 24 hour  Intake 60 ml  Output 800 ml  Net -740 ml   Filed Weights   03/07/21 1348  Weight: 45.8 kg    Examination:  General exam: Overall  comfortable, not in distress, pleasant elderly female, deconditioned HEENT: PERRL Respiratory system:  no wheezes or crackles  Cardiovascular system: S1 & S2 heard, RRR.  Gastrointestinal system: Abdomen is nondistended, soft and nontender. Central nervous system: Alert and oriented Extremities: No edema, no clubbing ,no cyanosis Skin: No rashes, no ulcers,no icterus  , cast on the left upper extremity    Data Reviewed: I have personally reviewed following labs and imaging studies  CBC: Recent Labs  Lab 03/07/21 1421 03/08/21 0527  WBC 12.9* 10.2  NEUTROABS 10.5*  --   HGB 13.7 13.3  HCT 42.3 40.6  MCV 92.2 92.1  PLT 230 198   Basic Metabolic Panel: Recent Labs  Lab 03/07/21 1421  NA 135  K 3.7  CL 96*  CO2 30  GLUCOSE 99  BUN 18  CREATININE 0.75  CALCIUM 9.8   GFR: Estimated Creatinine Clearance: 32.5 mL/min (by C-G formula based on SCr of 0.75 mg/dL). Liver Function Tests: No results for input(s): AST, ALT, ALKPHOS, BILITOT, PROT, ALBUMIN in the last 168 hours. No results for input(s): LIPASE, AMYLASE in the last 168 hours. No results for input(s): AMMONIA in the last 168 hours. Coagulation Profile: No results for input(s): INR, PROTIME in the last 168 hours. Cardiac Enzymes: No results for input(s): CKTOTAL, CKMB, CKMBINDEX, TROPONINI in the last 168 hours. BNP (last 3 results) No results for input(s): PROBNP in the last 8760 hours. HbA1C: No results for input(s): HGBA1C in the last 72 hours. CBG: No results for input(s): GLUCAP in the last 168 hours. Lipid Profile: No results for input(s): CHOL, HDL, LDLCALC, TRIG, CHOLHDL, LDLDIRECT in the last 72 hours. Thyroid Function Tests: No results for input(s): TSH, T4TOTAL, FREET4, T3FREE, THYROIDAB in the last 72 hours. Anemia Panel: No results for input(s): VITAMINB12, FOLATE, FERRITIN, TIBC, IRON, RETICCTPCT in the last 72 hours. Sepsis Labs: No results for input(s): PROCALCITON, LATICACIDVEN in the last  168 hours.  Recent Results (from the past 240 hour(s))  Resp Panel by RT-PCR (Flu A&B, Covid) Nasopharyngeal Swab     Status: None   Collection Time: 03/07/21  2:01 PM   Specimen: Nasopharyngeal Swab; Nasopharyngeal(NP) swabs in vial transport medium  Result Value Ref Range Status   SARS Coronavirus 2 by RT PCR NEGATIVE NEGATIVE Final    Comment: (NOTE) SARS-CoV-2 target nucleic acids are NOT DETECTED.  The SARS-CoV-2 RNA is generally detectable in upper respiratory specimens during the acute phase of infection. The lowest concentration of SARS-CoV-2 viral copies this assay can detect is 138 copies/mL. A negative result does not preclude SARS-Cov-2 infection and should not be used as the sole basis for treatment or other patient management decisions. A negative result may occur with  improper specimen collection/handling, submission of specimen other than nasopharyngeal swab, presence of viral mutation(s) within the areas targeted by this assay, and inadequate number  of viral copies(<138 copies/mL). A negative result must be combined with clinical observations, patient history, and epidemiological information. The expected result is Negative.  Fact Sheet for Patients:  BloggerCourse.com  Fact Sheet for Healthcare Providers:  SeriousBroker.it  This test is no t yet approved or cleared by the [place] FDA and  has been authorized for detection and/or diagnosis of SARS-CoV-2 by FDA under an Emergency Use Authorization (EUA). This EUA will remain  in effect (meaning this test can be used) for the duration of the COVID-19 declaration under Section 564(b)(1) of the Act, 21 U.S.C.section 360bbb-3(b)(1), unless the authorization is terminated  or revoked sooner.       Influenza A by PCR NEGATIVE NEGATIVE Final   Influenza B by PCR NEGATIVE NEGATIVE Final    Comment: (NOTE) The Xpert Xpress SARS-CoV-2/FLU/RSV plus assay is  intended as an aid in the diagnosis of influenza from Nasopharyngeal swab specimens and should not be used as a sole basis for treatment. Nasal washings and aspirates are unacceptable for Xpert Xpress SARS-CoV-2/FLU/RSV testing.  Fact Sheet for Patients: BloggerCourse.com  Fact Sheet for Healthcare Providers: SeriousBroker.it  This test is not yet approved or cleared by the [place] FDA and has been authorized for detection and/or diagnosis of SARS-CoV-2 by FDA under an Emergency Use Authorization (EUA). This EUA will remain in effect (meaning this test can be used) for the duration of the COVID-19 declaration under Section 564(b)(1) of the Act, 21 U.S.C. section 360bbb-3(b)(1), unless the authorization is terminated or revoked.  Performed at [facility], [address], [place], [place] [address]   Respiratory (~20 pathogens) panel by PCR     Status: None   Collection Time: 03/08/21  6:07 AM   Specimen: Nasopharyngeal Swab; Respiratory  Result Value Ref Range Status   Adenovirus NOT DETECTED NOT DETECTED Final   Coronavirus 229E NOT DETECTED NOT DETECTED Final    Comment: (NOTE) The Coronavirus on the Respiratory Panel, DOES NOT test for the novel  Coronavirus (2019 nCoV)    Coronavirus HKU1 NOT DETECTED NOT DETECTED Final   Coronavirus NL63 NOT DETECTED NOT DETECTED Final   Coronavirus OC43 NOT DETECTED NOT DETECTED Final   Metapneumovirus NOT DETECTED NOT DETECTED Final   Rhinovirus / Enterovirus NOT DETECTED NOT DETECTED Final   Influenza A NOT DETECTED NOT DETECTED Final   Influenza B NOT DETECTED NOT DETECTED Final   Parainfluenza Virus 1 NOT DETECTED NOT DETECTED Final   Parainfluenza Virus 2 NOT DETECTED NOT DETECTED Final   Parainfluenza Virus 3 NOT DETECTED NOT DETECTED Final   Parainfluenza Virus 4 NOT DETECTED NOT DETECTED Final   Respiratory Syncytial Virus NOT DETECTED NOT DETECTED  Final   Bordetella pertussis NOT DETECTED NOT DETECTED Final   Bordetella Parapertussis NOT DETECTED NOT DETECTED Final   Chlamydophila pneumoniae NOT DETECTED NOT DETECTED Final   Mycoplasma pneumoniae NOT DETECTED NOT DETECTED Final    Comment: Performed at [facility], [address], [place], [place] [address]  Urine Culture     Status: Abnormal   Collection Time: 03/08/21  9:32 AM   Specimen: Urine, Clean Catch  Result Value Ref Range Status   Specimen Description   Final    URINE, CLEAN CATCH Performed at [facility], [address] [place], [place] [address]    Special Requests   Final    NONE Performed at [facility], [address] [place], [place] [address]    Culture >=100,000  COLONIES/mL ENTEROCOCCUS FAECALIS (A)  Final   Report Status 03/10/2021 FINAL  Final   Organism ID, Bacteria ENTEROCOCCUS FAECALIS (A)  Final      Susceptibility   Enterococcus faecalis - MIC*    AMPICILLIN <=2 SENSITIVE Sensitive     NITROFURANTOIN <=16 SENSITIVE Sensitive     VANCOMYCIN 1 SENSITIVE Sensitive     * >=100,000 COLONIES/mL ENTEROCOCCUS FAECALIS         Radiology Studies: No results found.      Scheduled Meds:  amLODipine  2.5 mg Oral BID   busPIRone  15 mg Oral BID   Carbidopa-Levodopa ER  2 tablet Oral Daily   enoxaparin (LOVENOX) injection  40 mg Subcutaneous Q24H   escitalopram  2.5 mg Oral Daily   fluticasone furoate-vilanterol  1 puff Inhalation Daily   gabapentin  100 mg Oral TID   hydrochlorothiazide  12.5 mg Oral Daily   ipratropium-albuterol  3 mL Nebulization QID   levothyroxine  75 mcg Oral Q0600   losartan  100 mg Oral Daily   morphine  30 mg Oral BID   pravastatin  80 mg Oral Daily   predniSONE  40 mg Oral Q breakfast   sodium chloride flush  3 mL Intravenous Q12H   umeclidinium bromide  1 puff Inhalation Daily   Continuous Infusions:     LOS: 3 days    Time spent:25 mins. More than 50% of  that time was spent in counseling and/or coordination of care.      Burnadette Pop, MD [facility] P11/20/2022, 8:16 AM

## 2021-03-12 ENCOUNTER — Other Ambulatory Visit: Payer: Self-pay

## 2021-03-12 LAB — SARS CORONAVIRUS 2 (TAT 6-24 HRS): SARS Coronavirus 2: NEGATIVE

## 2021-03-12 LAB — RESP PANEL BY RT-PCR (FLU A&B, COVID) ARPGX2
Influenza A by PCR: NEGATIVE
Influenza B by PCR: NEGATIVE
SARS Coronavirus 2 by RT PCR: NEGATIVE

## 2021-03-12 MED ORDER — NITROFURANTOIN MONOHYD MACRO 100 MG PO CAPS
100.0000 mg | ORAL_CAPSULE | Freq: Two times a day (BID) | ORAL | Status: DC
  Administered 2021-03-12 – 2021-03-14 (×5): 100 mg via ORAL
  Filled 2021-03-12 (×5): qty 1

## 2021-03-12 MED ORDER — TEMAZEPAM 30 MG PO CAPS: 30.0000 mg | ORAL_CAPSULE | Freq: Every evening | ORAL | 0 refills | Status: DC | PRN

## 2021-03-12 MED ORDER — OXYCODONE-ACETAMINOPHEN 5-325 MG PO TABS: 1.0000 | ORAL_TABLET | Freq: Three times a day (TID) | ORAL | 0 refills | Status: AC | PRN

## 2021-03-12 MED ORDER — NITROFURANTOIN MACROCRYSTAL 100 MG PO CAPS: 100.0000 mg | ORAL_CAPSULE | Freq: Two times a day (BID) | ORAL | Status: DC

## 2021-03-12 MED ORDER — OXYCODONE-ACETAMINOPHEN 5-325 MG PO TABS: 1.0000 | ORAL_TABLET | Freq: Three times a day (TID) | ORAL | 0 refills | Status: DC | PRN

## 2021-03-12 NOTE — TOC Progression Note (Signed)
Transition of Care [facility]) - Progression Note    Patient Details  Name: Kiara Jones MRN: [id number] Date of Birth: October 28, 1935  Transition of Care [facility]) CM/SW Contact  Ida Rogue, [place] Phone Number: 03/12/2021, 1:24 PM  Clinical Narrative:   Facility discovered patient has open claims with [employer] that puts their reimbusement with [employer] in jeapordy. Facility is asking for resolution, and patient and her brother in law are working to resolve this so she can transfer to facility. Marchelle Folks at [facility] is also working with her administration to see about bringing in patient on good faith seeing as how family is diligently working on this. TOC will continue to follow during the course of hospitalization.     Expected Discharge Plan: Skilled Nursing Facility Barriers to Discharge: Other (must enter comment) (unresolved clain with insurance)  Expected Discharge Plan and Services Expected Discharge Plan: Skilled Nursing Facility   Discharge Planning Services: CM Consult Post Acute Care Choice: Skilled Nursing Facility Living arrangements for the past 2 months: Single Family Home                                       Social Determinants of Health (SDOH) Interventions    Readmission Risk Interventions No flowsheet data found.

## 2021-03-12 NOTE — Progress Notes (Signed)
Physical Therapy Treatment Patient Details Name: Kiara Jones MRN: [id number] DOB: February 07, 1936 Today's Date: 03/12/2021   History of Present Illness Kiara Jones is a 85 y.o. female with medical history significant of COPD, hyperlipidemia, hypertension, hypothyroidism, lymphedema, RLS, neuropathy, spinal stenosis, CKD who presents to ED 03/07/21 with ongoing shortness of breath and cough. post fall 01/22/21 and sustained  distal left radius and ulna fracture/splinted in ED    PT Comments    Patient quite cheerful, min assistance for bed mobility, mod for pivot steps to [facility] and back to bed. Patient plans SNF soon.   Recommendations for follow up therapy are one component of a multi-disciplinary discharge planning process, led by the attending physician.  Recommendations may be updated based on patient status, additional functional criteria and insurance authorization.  Follow Up Recommendations  Skilled nursing-short term rehab (<3 hours/day)     Assistance Recommended at Discharge Frequent or constant Supervision/Assistance  Equipment Recommendations  None recommended by PT    Recommendations for Other Services       Precautions / Restrictions Precautions Precautions: Fall Precaution Comments: urinary urgency, need briefs Restrictions Other Position/Activity Restrictions: , assume NWB hand wrist- in cast     Mobility  Bed Mobility   Bed Mobility: Supine to Sit;Sit to Supine     Supine to sit: Mod assist Sit to supine: Min assist   General bed mobility comments: assist with legs and trunk to retrun to supine    Transfers Overall transfer level: Needs assistance Equipment used: 1 person hand held assist Transfers: Sit to/from Stand;Bed to chair/wheelchair/BSC Sit to Stand: Min assist;+2 safety/equipment           General transfer comment: stands from bed assist. Able to step pivot to [facility] and back to bed. with HHA and 1 UE support at L elbow     Ambulation/Gait                   Stairs             Wheelchair Mobility    Modified Rankin (Stroke Patients Only)       Balance Overall balance assessment: Needs assistance Sitting-balance support: Single extremity supported;Feet supported Sitting balance-Leahy Scale: Fair     Standing balance support: During functional activity;Single extremity supported Standing balance-Leahy Scale: Poor Standing balance comment: required external support                            Cognition Arousal/Alertness: Awake/alert Behavior During Therapy: WFL for tasks assessed/performed Overall Cognitive Status: Within Functional Limits for tasks assessed                                          Exercises      General Comments        Pertinent Vitals/Pain Pain Score: 9  Pain Location: left fingers and forearm, back Pain Descriptors / Indicators: Discomfort;Grimacing;Guarding Pain Intervention(s): Monitored during session;Limited activity within patient's tolerance;Patient requesting pain meds-RN notified    Home Living                          Prior Function            PT Goals (current goals can now be found in the care plan section) Progress towards PT goals: Progressing toward goals  Frequency    Min 2X/week      PT Plan Current plan remains appropriate    Co-evaluation              AM-PAC PT "6 Clicks" Mobility   Outcome Measure  Help needed turning from your back to your side while in a flat bed without using bedrails?: A Little Help needed moving from lying on your back to sitting on the side of a flat bed without using bedrails?: A Little Help needed moving to and from a bed to a chair (including a wheelchair)?: A Lot Help needed standing up from a chair using your arms (e.g., wheelchair or bedside chair)?: A Lot Help needed to walk in hospital room?: Total Help needed climbing 3-5 steps with a  railing? : Total 6 Click Score: 12    End of Session   Activity Tolerance: Patient tolerated treatment well Patient left: in bed;with call bell/phone within reach;with bed alarm set Nurse Communication: Mobility status PT Visit Diagnosis: Unsteadiness on feet (R26.81);Difficulty in walking, not elsewhere classified (R26.2);Pain Pain - Right/Left: Left Pain - part of body: Hand     Time: 9211-9417 PT Time Calculation (min) (ACUTE ONLY): 29 min  Charges:  $Therapeutic Activity: 23-37 mins                     Blanchard Kelch PT Acute Rehabilitation Services Pager [phone] Office [phone]    Rada Hay 03/12/2021, 5:08 PM

## 2021-03-12 NOTE — Care Management Important Message (Signed)
Important Message  Patient Details IM Letter placed in Patients room. Name: Kiara Jones MRN: [id number] Date of Birth: 10/04/1935   Medicare Important Message Given:  Yes     Caren Macadam 03/12/2021, 11:36 AM

## 2021-03-12 NOTE — Progress Notes (Signed)
1 PROGRESS NOTE    Kiara Jones  GXQ:[id number] DOB: [birth year] DOA: 03/07/2021 PCP: Carmel Sacramento, NP   Chief Complain: Shortness of breath, cough  Brief Narrative: Patient is a 85 year old female with history of COPD, hyperlipidemia, hypertension, hypothyroidism, lymphedema, restless leg syndrome, spinal stenosis who presented from home with complaints of shortness of breath, cough.  She lives with her brother.  She recently had a fracture of her left wrist after she fell at home.  She was complaining of progressive shortness of breath and cough for last 3 to 4 days.  She also reported mild fever at home along with increased urgency, frequency of urine, dysuria.  She was found to be hypoxic on room air and had to be put on 2 L of oxygen.  Lab work showed mild leukocytosis.  Flu, COVID screen test negative.  Chest x-ray did not show pneumonia.  Patient was admitted for the management of COPD exacerbation and possible UTI.  Respiratory status has significantly improved.  Currently she is stable for discharge to skilled nursing facility as soon as bed is available  Assessment & Plan:   Principal Problem:   COPD exacerbation (HCC) Active Problems:   Hypothyroidism   RLS (restless legs syndrome)   Spinal stenosis of lumbar region   Hyperlipidemia   Hypertension   Acute respiratory failure with hypoxia (HCC)   COPD exacerbation: Has longstanding history of COPD.  Takes inhalers at home.  Not on oxygen at home.  Presented with shortness of breath, cough.  Hypoxia on arrival with wheezing.  Chest x-ray did not show pneumonia.  Started on steroids, now changed to oral.  Continue bronchodilators as needed. She has been  on room air.  Wheezings have improved.  Suspected UTI: UA suggestive  of UTI.  Urine culture showed  Enterococcus faecalis.  She was on ceftriaxone which has been discontinued now.  We treated  her with a dose of fosfomycin.  She is allergic to penicillin .she complained of  increased frequency, dysuria on admission,now better but she still feels uncomfortable when she starts passing her urine.  Started on 5 days course of nitrofurantoin.  Hypothyroidism: Continue Synthyroid  Hyperlipidemia: Continue statin  Hypertension: Currently BP stable.  Takes amlodipine, hydrochlorothiazide, losartan  Neuropathy: On gabapentin  Anxiety/depression: On BuSpar, Lexapro, temazepam  Chronic back pain/spinal stenosis/recent fall/recent left wrist fracture: Has cast on the left wrist.  She follows with physical therapy at home.  PT/OT recommending skilled stability on discharge.   TOC referral made.  Waiting for insurance authorization.         DVT prophylaxis:Lovenox Code Status: Full Family Communication:: Discussed with brother on phone on 11/18 Status is: Inpatient   Consultants: None  Procedures: None  Antimicrobials:  Anti-infectives (From admission, onward)    Start     Dose/Rate Route Frequency Ordered Stop   03/10/21 0830  fosfomycin (MONUROL) packet 3 g        3 g Oral  Once 03/10/21 0736 03/10/21 0839   03/08/21 1100  cefTRIAXone (ROCEPHIN) 1 g in sodium chloride 0.9 % 100 mL IVPB  Status:  Discontinued        1 g 200 mL/hr over 30 Minutes Intravenous Every 24 hours 03/08/21 1001 03/10/21 0736   03/07/21 1945  doxycycline (VIBRA-TABS) tablet 100 mg        100 mg Oral  Once 03/07/21 1939 03/07/21 2023       Subjective:  Patient seen and examined at the bedside this morning.  Hemodynamically  stable for discharge whenever possible.  Remains comfortable  Objective: Vitals:   03/11/21 2005 03/11/21 2208 03/12/21 0522 03/12/21 0838  BP:  (!) 146/67 130/78   Pulse:  87 76   Resp:  20 16   Temp:  98.2 F (36.8 C) 98.2 F (36.8 C)   TempSrc:  Oral Oral   SpO2: 92% 93% 92% 92%  Weight:      Height:       No intake or output data in the 24 hours ending 03/12/21 1301  Filed Weights   03/07/21 1348  Weight: 45.8 kg     Examination:  General exam: Overall comfortable, not in distress, pleasant elderly female HEENT: PERRL Respiratory system:  no wheezes or crackles  Cardiovascular system: S1 & S2 heard, RRR.  Gastrointestinal system: Abdomen is nondistended, soft and nontender. Central nervous system: Alert and oriented Extremities: No edema, no clubbing ,no cyanosis, cast on the left forearm Skin: No rashes, no ulcers,no icterus     Data Reviewed: I have personally reviewed following labs and imaging studies  CBC: Recent Labs  Lab 03/07/21 1421 03/08/21 0527  WBC 12.9* 10.2  NEUTROABS 10.5*  --   HGB 13.7 13.3  HCT 42.3 40.6  MCV 92.2 92.1  PLT 230 198   Basic Metabolic Panel: Recent Labs  Lab 03/07/21 1421  NA 135  K 3.7  CL 96*  CO2 30  GLUCOSE 99  BUN 18  CREATININE 0.75  CALCIUM 9.8   GFR: Estimated Creatinine Clearance: 32.5 mL/min (by C-G formula based on SCr of 0.75 mg/dL). Liver Function Tests: No results for input(s): AST, ALT, ALKPHOS, BILITOT, PROT, ALBUMIN in the last 168 hours. No results for input(s): LIPASE, AMYLASE in the last 168 hours. No results for input(s): AMMONIA in the last 168 hours. Coagulation Profile: No results for input(s): INR, PROTIME in the last 168 hours. Cardiac Enzymes: No results for input(s): CKTOTAL, CKMB, CKMBINDEX, TROPONINI in the last 168 hours. BNP (last 3 results) No results for input(s): PROBNP in the last 8760 hours. HbA1C: No results for input(s): HGBA1C in the last 72 hours. CBG: No results for input(s): GLUCAP in the last 168 hours. Lipid Profile: No results for input(s): CHOL, HDL, LDLCALC, TRIG, CHOLHDL, LDLDIRECT in the last 72 hours. Thyroid Function Tests: No results for input(s): TSH, T4TOTAL, FREET4, T3FREE, THYROIDAB in the last 72 hours. Anemia Panel: No results for input(s): VITAMINB12, FOLATE, FERRITIN, TIBC, IRON, RETICCTPCT in the last 72 hours. Sepsis Labs: No results for input(s): PROCALCITON,  LATICACIDVEN in the last 168 hours.  Recent Results (from the past 240 hour(s))  Resp Panel by RT-PCR (Flu A&B, Covid) Nasopharyngeal Swab     Status: None   Collection Time: 03/07/21  2:01 PM   Specimen: Nasopharyngeal Swab; Nasopharyngeal(NP) swabs in vial transport medium  Result Value Ref Range Status   SARS Coronavirus 2 by RT PCR NEGATIVE NEGATIVE Final    Comment: (NOTE) SARS-CoV-2 target nucleic acids are NOT DETECTED.  The SARS-CoV-2 RNA is generally detectable in upper respiratory specimens during the acute phase of infection. The lowest concentration of SARS-CoV-2 viral copies this assay can detect is 138 copies/mL. A negative result does not preclude SARS-Cov-2 infection and should not be used as the sole basis for treatment or other patient management decisions. A negative result may occur with  improper specimen collection/handling, submission of specimen other than nasopharyngeal swab, presence of viral mutation(s) within the areas targeted by this assay, and inadequate number of viral copies(<138 copies/mL).  A negative result must be combined with clinical observations, patient history, and epidemiological information. The expected result is Negative.  Fact Sheet for Patients:  BloggerCourse.com  Fact Sheet for Healthcare Providers:  SeriousBroker.it  This test is no t yet approved or cleared by the [place] FDA and  has been authorized for detection and/or diagnosis of SARS-CoV-2 by FDA under an Emergency Use Authorization (EUA). This EUA will remain  in effect (meaning this test can be used) for the duration of the COVID-19 declaration under Section 564(b)(1) of the Act, 21 U.S.C.section 360bbb-3(b)(1), unless the authorization is terminated  or revoked sooner.       Influenza A by PCR NEGATIVE NEGATIVE Final   Influenza B by PCR NEGATIVE NEGATIVE Final    Comment: (NOTE) The Xpert Xpress  SARS-CoV-2/FLU/RSV plus assay is intended as an aid in the diagnosis of influenza from Nasopharyngeal swab specimens and should not be used as a sole basis for treatment. Nasal washings and aspirates are unacceptable for Xpert Xpress SARS-CoV-2/FLU/RSV testing.  Fact Sheet for Patients: BloggerCourse.com  Fact Sheet for Healthcare Providers: SeriousBroker.it  This test is not yet approved or cleared by the [place] FDA and has been authorized for detection and/or diagnosis of SARS-CoV-2 by FDA under an Emergency Use Authorization (EUA). This EUA will remain in effect (meaning this test can be used) for the duration of the COVID-19 declaration under Section 564(b)(1) of the Act, 21 U.S.C. section 360bbb-3(b)(1), unless the authorization is terminated or revoked.  Performed at [facility], [address] [place], [place] [address]   Respiratory (~20 pathogens) panel by PCR     Status: None   Collection Time: 03/08/21  6:07 AM   Specimen: Nasopharyngeal Swab; Respiratory  Result Value Ref Range Status   Adenovirus NOT DETECTED NOT DETECTED Final   Coronavirus 229E NOT DETECTED NOT DETECTED Final    Comment: (NOTE) The Coronavirus on the Respiratory Panel, DOES NOT test for the novel  Coronavirus (2019 nCoV)    Coronavirus HKU1 NOT DETECTED NOT DETECTED Final   Coronavirus NL63 NOT DETECTED NOT DETECTED Final   Coronavirus OC43 NOT DETECTED NOT DETECTED Final   Metapneumovirus NOT DETECTED NOT DETECTED Final   Rhinovirus / Enterovirus NOT DETECTED NOT DETECTED Final   Influenza A NOT DETECTED NOT DETECTED Final   Influenza B NOT DETECTED NOT DETECTED Final   Parainfluenza Virus 1 NOT DETECTED NOT DETECTED Final   Parainfluenza Virus 2 NOT DETECTED NOT DETECTED Final   Parainfluenza Virus 3 NOT DETECTED NOT DETECTED Final   Parainfluenza Virus 4 NOT DETECTED NOT DETECTED Final   Respiratory Syncytial  Virus NOT DETECTED NOT DETECTED Final   Bordetella pertussis NOT DETECTED NOT DETECTED Final   Bordetella Parapertussis NOT DETECTED NOT DETECTED Final   Chlamydophila pneumoniae NOT DETECTED NOT DETECTED Final   Mycoplasma pneumoniae NOT DETECTED NOT DETECTED Final    Comment: Performed at [facility], [address], [place], [place] [address]  Urine Culture     Status: Abnormal   Collection Time: 03/08/21  9:32 AM   Specimen: Urine, Clean Catch  Result Value Ref Range Status   Specimen Description   Final    URINE, CLEAN CATCH Performed at [facility], [address], [place], [place] [address]    Special Requests   Final    NONE Performed at [facility], [address] [place], [place] [address]    Culture >=100,000 COLONIES/mL ENTEROCOCCUS FAECALIS (A)  Final   Report Status 03/10/2021 FINAL  Final   Organism ID, Bacteria ENTEROCOCCUS FAECALIS (A)  Final      Susceptibility   Enterococcus faecalis - MIC*    AMPICILLIN <=2 SENSITIVE Sensitive     NITROFURANTOIN <=16 SENSITIVE Sensitive     VANCOMYCIN 1 SENSITIVE Sensitive     * >=100,000 COLONIES/mL ENTEROCOCCUS FAECALIS  Resp Panel by RT-PCR (Flu A&B, Covid) Nasopharyngeal Swab     Status: None   Collection Time: 03/12/21 11:10 AM   Specimen: Nasopharyngeal Swab; Nasopharyngeal(NP) swabs in vial transport medium  Result Value Ref Range Status   SARS Coronavirus 2 by RT PCR NEGATIVE NEGATIVE Final    Comment: (NOTE) SARS-CoV-2 target nucleic acids are NOT DETECTED.  The SARS-CoV-2 RNA is generally detectable in upper respiratory specimens during the acute phase of infection. The lowest concentration of SARS-CoV-2 viral copies this assay can detect is 138 copies/mL. A negative result does not preclude SARS-Cov-2 infection and should not be used as the sole basis for treatment or other patient management decisions. A negative result may occur with  improper specimen  collection/handling, submission of specimen other than nasopharyngeal swab, presence of viral mutation(s) within the areas targeted by this assay, and inadequate number of viral copies(<138 copies/mL). A negative result must be combined with clinical observations, patient history, and epidemiological information. The expected result is Negative.  Fact Sheet for Patients:  BloggerCourse.com  Fact Sheet for Healthcare Providers:  SeriousBroker.it  This test is no t yet approved or cleared by the [place] FDA and  has been authorized for detection and/or diagnosis of SARS-CoV-2 by FDA under an Emergency Use Authorization (EUA). This EUA will remain  in effect (meaning this test can be used) for the duration of the COVID-19 declaration under Section 564(b)(1) of the Act, 21 U.S.C.section 360bbb-3(b)(1), unless the authorization is terminated  or revoked sooner.       Influenza A by PCR NEGATIVE NEGATIVE Final   Influenza B by PCR NEGATIVE NEGATIVE Final    Comment: (NOTE) The Xpert Xpress SARS-CoV-2/FLU/RSV plus assay is intended as an aid in the diagnosis of influenza from Nasopharyngeal swab specimens and should not be used as a sole basis for treatment. Nasal washings and aspirates are unacceptable for Xpert Xpress SARS-CoV-2/FLU/RSV testing.  Fact Sheet for Patients: BloggerCourse.com  Fact Sheet for Healthcare Providers: SeriousBroker.it  This test is not yet approved or cleared by the [place] FDA and has been authorized for detection and/or diagnosis of SARS-CoV-2 by FDA under an Emergency Use Authorization (EUA). This EUA will remain in effect (meaning this test can be used) for the duration of the COVID-19 declaration under Section 564(b)(1) of the Act, 21 U.S.C. section 360bbb-3(b)(1), unless the authorization is terminated or revoked.  Performed at [facility], [address] [place] [address], [place], [place] [address]          Radiology Studies: No results found.      Scheduled Meds:  amLODipine  2.5 mg Oral BID   busPIRone  15 mg Oral BID   Carbidopa-Levodopa ER  2 tablet Oral Daily   enoxaparin (LOVENOX) injection  40 mg Subcutaneous Q24H   escitalopram  2.5 mg Oral Daily   fluticasone furoate-vilanterol  1 puff Inhalation Daily   gabapentin  100 mg Oral TID   hydrochlorothiazide  12.5 mg Oral Daily   ipratropium-albuterol  3 mL Nebulization QID   levothyroxine  75 mcg Oral Q0600   losartan  100 mg Oral Daily  morphine  30 mg Oral BID   pravastatin  80 mg Oral Daily   sodium chloride flush  3 mL Intravenous Q12H   umeclidinium bromide  1 puff Inhalation Daily   Continuous Infusions:     LOS: 4 days    Time spent:25 mins. More than 50% of that time was spent in counseling and/or coordination of care.      Burnadette Pop, MD [facility] P11/21/2022, 1:01 PM

## 2021-03-13 MED ORDER — ALUM & MAG HYDROXIDE-SIMETH 200-200-20 MG/5ML PO SUSP
30.0000 mL | Freq: Four times a day (QID) | ORAL | Status: DC | PRN
  Administered 2021-03-13: 30 mL via ORAL
  Filled 2021-03-13: qty 30

## 2021-03-13 NOTE — Progress Notes (Signed)
1 PROGRESS NOTE    Kiara Jones  ASN:[id number] DOB: 03-Jan-1936 DOA: 03/07/2021 PCP: Carmel Sacramento, NP   Chief Complain: Shortness of breath, cough  Brief Narrative: Patient is a 85 year old female with history of COPD, hyperlipidemia, hypertension, hypothyroidism, lymphedema, restless leg syndrome, spinal stenosis who presented from home with complaints of shortness of breath, cough.  She lives with her brother.  She recently had a fracture of her left wrist after she fell at home.  She was complaining of progressive shortness of breath and cough for last 3 to 4 days.  She also reported mild fever at home along with increased urgency, frequency of urine, dysuria.  She was found to be hypoxic on room air and had to be put on 2 L of oxygen.  Lab work showed mild leukocytosis.  Flu, COVID screen test negative.  Chest x-ray did not show pneumonia.  Patient was admitted for the management of COPD exacerbation and possible UTI.  Respiratory status has significantly improved.  Currently she is stable for discharge to skilled nursing facility as soon as bed is available  Assessment & Plan:   Principal Problem:   COPD exacerbation (HCC) Active Problems:   Hypothyroidism   RLS (restless legs syndrome)   Spinal stenosis of lumbar region   Hyperlipidemia   Hypertension   Acute respiratory failure with hypoxia (HCC)   COPD exacerbation: Has longstanding history of COPD.  Takes inhalers at home.  Not on oxygen at home.  Presented with shortness of breath, cough.  Hypoxia on arrival with wheezing.  Chest x-ray did not show pneumonia.  Started on steroids, now changed to oral,finished 5 days course.  Continue bronchodilators as needed. She has been  on room air.  Wheezings have improved.  Suspected UTI: UA suggestive  of UTI.  Urine culture showed  Enterococcus faecalis.  She was on ceftriaxone which has been discontinued now.  We treated  her with a dose of fosfomycin.  She is allergic to penicillin  .she complained of increased frequency, dysuria on admission,now better but she still feels uncomfortable when she starts passing her urine.  Started on 5 days course of nitrofurantoin.  Currently better  Hypothyroidism: Continue Synthyroid  Hyperlipidemia: Continue statin  Hypertension: Currently BP stable.  Takes amlodipine, hydrochlorothiazide, losartan  Neuropathy: On gabapentin  Anxiety/depression: On BuSpar, Lexapro, temazepam  Chronic back pain/spinal stenosis/recent fall/recent left wrist fracture: Has cast on the left wrist.  She follows with physical therapy at home.  PT/OT recommending skilled stability on discharge.   TOC referral made.  Waiting for insurance authorization.         DVT prophylaxis:Lovenox Code Status: Full Family Communication:: Discussed with brother on phone on 11/18 Status is: Inpatient   Consultants: None  Procedures: None  Antimicrobials:  Anti-infectives (From admission, onward)    Start     Dose/Rate Route Frequency Ordered Stop   03/12/21 1400  nitrofurantoin (MACRODANTIN) capsule 100 mg  Status:  Discontinued        100 mg Oral Every 12 hours 03/12/21 1303 03/12/21 1314   03/12/21 1400  nitrofurantoin (macrocrystal-monohydrate) (MACROBID) capsule 100 mg        100 mg Oral Every 12 hours 03/12/21 1314 03/17/21 0959   03/10/21 0830  fosfomycin (MONUROL) packet 3 g        3 g Oral  Once 03/10/21 0736 03/10/21 0839   03/08/21 1100  cefTRIAXone (ROCEPHIN) 1 g in sodium chloride 0.9 % 100 mL IVPB  Status:  Discontinued  1 g 200 mL/hr over 30 Minutes Intravenous Every 24 hours 03/08/21 1001 03/10/21 0736   03/07/21 1945  doxycycline (VIBRA-TABS) tablet 100 mg        100 mg Oral  Once 03/07/21 1939 03/07/21 2023       Subjective:  Patient seen and examined the bedside this morning.  Hemodynamically stable.  Denies any complaints today.  Objective: Vitals:   03/13/21 0336 03/13/21 0800 03/13/21 1100 03/13/21 1220  BP: 135/81  136/83  121/77  Pulse: 78 88  92  Resp: 18 18  16   Temp: 98.4 F (36.9 C) 99.5 F (37.5 C)  98.5 F (36.9 C)  TempSrc: Oral Oral  Oral  SpO2: 93% 93% 91% 92%  Weight:      Height:        Intake/Output Summary (Last 24 hours) at 03/13/2021 1321 Last data filed at 03/13/2021 1026 Gross per 24 hour  Intake 118 ml  Output --  Net 118 ml    Filed Weights   03/07/21 1348  Weight: 45.8 kg    Examination:  General exam: Overall comfortable, not in distress, pleasant elderly female HEENT: PERRL Respiratory system:  no wheezes or crackles  Cardiovascular system: S1 & S2 heard, RRR.  Gastrointestinal system: Abdomen is nondistended, soft and nontender. Central nervous system: Alert and oriented Extremities: No edema, no clubbing ,no cyanosis Skin: No rashes, no ulcers,no icterus ,cast on the left forearm   Data Reviewed: I have personally reviewed following labs and imaging studies  CBC: Recent Labs  Lab 03/07/21 1421 03/08/21 0527  WBC 12.9* 10.2  NEUTROABS 10.5*  --   HGB 13.7 13.3  HCT 42.3 40.6  MCV 92.2 92.1  PLT 230 198   Basic Metabolic Panel: Recent Labs  Lab 03/07/21 1421  NA 135  K 3.7  CL 96*  CO2 30  GLUCOSE 99  BUN 18  CREATININE 0.75  CALCIUM 9.8   GFR: Estimated Creatinine Clearance: 32.5 mL/min (by C-G formula based on SCr of 0.75 mg/dL). Liver Function Tests: No results for input(s): AST, ALT, ALKPHOS, BILITOT, PROT, ALBUMIN in the last 168 hours. No results for input(s): LIPASE, AMYLASE in the last 168 hours. No results for input(s): AMMONIA in the last 168 hours. Coagulation Profile: No results for input(s): INR, PROTIME in the last 168 hours. Cardiac Enzymes: No results for input(s): CKTOTAL, CKMB, CKMBINDEX, TROPONINI in the last 168 hours. BNP (last 3 results) No results for input(s): PROBNP in the last 8760 hours. HbA1C: No results for input(s): HGBA1C in the last 72 hours. CBG: No results for input(s): GLUCAP in the last 168  hours. Lipid Profile: No results for input(s): CHOL, HDL, LDLCALC, TRIG, CHOLHDL, LDLDIRECT in the last 72 hours. Thyroid Function Tests: No results for input(s): TSH, T4TOTAL, FREET4, T3FREE, THYROIDAB in the last 72 hours. Anemia Panel: No results for input(s): VITAMINB12, FOLATE, FERRITIN, TIBC, IRON, RETICCTPCT in the last 72 hours. Sepsis Labs: No results for input(s): PROCALCITON, LATICACIDVEN in the last 168 hours.  Recent Results (from the past 240 hour(s))  Resp Panel by RT-PCR (Flu A&B, Covid) Nasopharyngeal Swab     Status: None   Collection Time: 03/07/21  2:01 PM   Specimen: Nasopharyngeal Swab; Nasopharyngeal(NP) swabs in vial transport medium  Result Value Ref Range Status   SARS Coronavirus 2 by RT PCR NEGATIVE NEGATIVE Final    Comment: (NOTE) SARS-CoV-2 target nucleic acids are NOT DETECTED.  The SARS-CoV-2 RNA is generally detectable in upper respiratory specimens during the  acute phase of infection. The lowest concentration of SARS-CoV-2 viral copies this assay can detect is 138 copies/mL. A negative result does not preclude SARS-Cov-2 infection and should not be used as the sole basis for treatment or other patient management decisions. A negative result may occur with  improper specimen collection/handling, submission of specimen other than nasopharyngeal swab, presence of viral mutation(s) within the areas targeted by this assay, and inadequate number of viral copies(<138 copies/mL). A negative result must be combined with clinical observations, patient history, and epidemiological information. The expected result is Negative.  Fact Sheet for Patients:  BloggerCourse.com  Fact Sheet for Healthcare Providers:  SeriousBroker.it  This test is no t yet approved or cleared by the [place] FDA and  has been authorized for detection and/or diagnosis of SARS-CoV-2 by FDA under an Emergency Use Authorization  (EUA). This EUA will remain  in effect (meaning this test can be used) for the duration of the COVID-19 declaration under Section 564(b)(1) of the Act, 21 U.S.C.section 360bbb-3(b)(1), unless the authorization is terminated  or revoked sooner.       Influenza A by PCR NEGATIVE NEGATIVE Final   Influenza B by PCR NEGATIVE NEGATIVE Final    Comment: (NOTE) The Xpert Xpress SARS-CoV-2/FLU/RSV plus assay is intended as an aid in the diagnosis of influenza from Nasopharyngeal swab specimens and should not be used as a sole basis for treatment. Nasal washings and aspirates are unacceptable for Xpert Xpress SARS-CoV-2/FLU/RSV testing.  Fact Sheet for Patients: BloggerCourse.com  Fact Sheet for Healthcare Providers: SeriousBroker.it  This test is not yet approved or cleared by the [place] FDA and has been authorized for detection and/or diagnosis of SARS-CoV-2 by FDA under an Emergency Use Authorization (EUA). This EUA will remain in effect (meaning this test can be used) for the duration of the COVID-19 declaration under Section 564(b)(1) of the Act, 21 U.S.C. section 360bbb-3(b)(1), unless the authorization is terminated or revoked.  Performed at [facility], [address], [place], [place] [address]   Respiratory (~20 pathogens) panel by PCR     Status: None   Collection Time: 03/08/21  6:07 AM   Specimen: Nasopharyngeal Swab; Respiratory  Result Value Ref Range Status   Adenovirus NOT DETECTED NOT DETECTED Final   Coronavirus 229E NOT DETECTED NOT DETECTED Final    Comment: (NOTE) The Coronavirus on the Respiratory Panel, DOES NOT test for the novel  Coronavirus (2019 nCoV)    Coronavirus HKU1 NOT DETECTED NOT DETECTED Final   Coronavirus NL63 NOT DETECTED NOT DETECTED Final   Coronavirus OC43 NOT DETECTED NOT DETECTED Final   Metapneumovirus NOT DETECTED NOT DETECTED Final   Rhinovirus /  Enterovirus NOT DETECTED NOT DETECTED Final   Influenza A NOT DETECTED NOT DETECTED Final   Influenza B NOT DETECTED NOT DETECTED Final   Parainfluenza Virus 1 NOT DETECTED NOT DETECTED Final   Parainfluenza Virus 2 NOT DETECTED NOT DETECTED Final   Parainfluenza Virus 3 NOT DETECTED NOT DETECTED Final   Parainfluenza Virus 4 NOT DETECTED NOT DETECTED Final   Respiratory Syncytial Virus NOT DETECTED NOT DETECTED Final   Bordetella pertussis NOT DETECTED NOT DETECTED Final   Bordetella Parapertussis NOT DETECTED NOT DETECTED Final   Chlamydophila pneumoniae NOT DETECTED NOT DETECTED Final   Mycoplasma pneumoniae NOT DETECTED NOT DETECTED Final    Comment: Performed at [facility], [address], [place], [place] [address]  Urine Culture     Status: Abnormal   Collection  Time: 03/08/21  9:32 AM   Specimen: Urine, Clean Catch  Result Value Ref Range Status   Specimen Description   Final    URINE, CLEAN CATCH Performed at [facility], [address], [place], [place] [address]    Special Requests   Final    NONE Performed at [facility], [address] [place], [place] [address]    Culture >=100,000 COLONIES/mL ENTEROCOCCUS FAECALIS (A)  Final   Report Status 03/10/2021 FINAL  Final   Organism ID, Bacteria ENTEROCOCCUS FAECALIS (A)  Final      Susceptibility   Enterococcus faecalis - MIC*    AMPICILLIN <=2 SENSITIVE Sensitive     NITROFURANTOIN <=16 SENSITIVE Sensitive     VANCOMYCIN 1 SENSITIVE Sensitive     * >=100,000 COLONIES/mL ENTEROCOCCUS FAECALIS  SARS CORONAVIRUS 2 (TAT 6-24 HRS) Nasopharyngeal Nasopharyngeal Swab     Status: None   Collection Time: 03/12/21  1:04 AM   Specimen: Nasopharyngeal Swab  Result Value Ref Range Status   SARS Coronavirus 2 NEGATIVE NEGATIVE Final    Comment: (NOTE) SARS-CoV-2 target nucleic acids are NOT DETECTED.  The SARS-CoV-2 RNA is generally detectable in upper and lower respiratory  specimens during the acute phase of infection. Negative results do not preclude SARS-CoV-2 infection, do not rule out co-infections with other pathogens, and should not be used as the sole basis for treatment or other patient management decisions. Negative results must be combined with clinical observations, patient history, and epidemiological information. The expected result is Negative.  Fact Sheet for Patients: HairSlick.no  Fact Sheet for Healthcare Providers: quierodirigir.com  This test is not yet approved or cleared by the [place] FDA and  has been authorized for detection and/or diagnosis of SARS-CoV-2 by FDA under an Emergency Use Authorization (EUA). This EUA will remain  in effect (meaning this test can be used) for the duration of the COVID-19 declaration under Se ction 564(b)(1) of the Act, 21 U.S.C. section 360bbb-3(b)(1), unless the authorization is terminated or revoked sooner.  Performed at [facility], [address], [place], [place] [address]   Resp Panel by RT-PCR (Flu A&B, Covid) Nasopharyngeal Swab     Status: None   Collection Time: 03/12/21 11:10 AM   Specimen: Nasopharyngeal Swab; Nasopharyngeal(NP) swabs in vial transport medium  Result Value Ref Range Status   SARS Coronavirus 2 by RT PCR NEGATIVE NEGATIVE Final    Comment: (NOTE) SARS-CoV-2 target nucleic acids are NOT DETECTED.  The SARS-CoV-2 RNA is generally detectable in upper respiratory specimens during the acute phase of infection. The lowest concentration of SARS-CoV-2 viral copies this assay can detect is 138 copies/mL. A negative result does not preclude SARS-Cov-2 infection and should not be used as the sole basis for treatment or other patient management decisions. A negative result may occur with  improper specimen collection/handling, submission of specimen other than nasopharyngeal swab, presence of viral mutation(s)  within the areas targeted by this assay, and inadequate number of viral copies(<138 copies/mL). A negative result must be combined with clinical observations, patient history, and epidemiological information. The expected result is Negative.  Fact Sheet for Patients:  BloggerCourse.com  Fact Sheet for Healthcare Providers:  SeriousBroker.it  This test is no t yet approved or cleared by the [place] FDA and  has been authorized for detection and/or diagnosis of SARS-CoV-2 by FDA under an Emergency Use Authorization (EUA). This EUA will remain  in effect (meaning this test can be used)  for the duration of the COVID-19 declaration under Section 564(b)(1) of the Act, 21 U.S.C.section 360bbb-3(b)(1), unless the authorization is terminated  or revoked sooner.       Influenza A by PCR NEGATIVE NEGATIVE Final   Influenza B by PCR NEGATIVE NEGATIVE Final    Comment: (NOTE) The Xpert Xpress SARS-CoV-2/FLU/RSV plus assay is intended as an aid in the diagnosis of influenza from Nasopharyngeal swab specimens and should not be used as a sole basis for treatment. Nasal washings and aspirates are unacceptable for Xpert Xpress SARS-CoV-2/FLU/RSV testing.  Fact Sheet for Patients: BloggerCourse.com  Fact Sheet for Healthcare Providers: SeriousBroker.it  This test is not yet approved or cleared by the [place] FDA and has been authorized for detection and/or diagnosis of SARS-CoV-2 by FDA under an Emergency Use Authorization (EUA). This EUA will remain in effect (meaning this test can be used) for the duration of the COVID-19 declaration under Section 564(b)(1) of the Act, 21 U.S.C. section 360bbb-3(b)(1), unless the authorization is terminated or revoked.  Performed at [facility], [address] [place], [place] [address]          Radiology Studies: No  results found.      Scheduled Meds:  amLODipine  2.5 mg Oral BID   busPIRone  15 mg Oral BID   Carbidopa-Levodopa ER  2 tablet Oral Daily   enoxaparin (LOVENOX) injection  40 mg Subcutaneous Q24H   escitalopram  2.5 mg Oral Daily   fluticasone furoate-vilanterol  1 puff Inhalation Daily   gabapentin  100 mg Oral TID   hydrochlorothiazide  12.5 mg Oral Daily   ipratropium-albuterol  3 mL Nebulization QID   levothyroxine  75 mcg Oral Q0600   losartan  100 mg Oral Daily   morphine  30 mg Oral BID   nitrofurantoin (macrocrystal-monohydrate)  100 mg Oral Q12H   pravastatin  80 mg Oral Daily   sodium chloride flush  3 mL Intravenous Q12H   umeclidinium bromide  1 puff Inhalation Daily   Continuous Infusions:     LOS: 5 days    Time spent:15 mins. More than 50% of that time was spent in counseling and/or coordination of care.      Burnadette Pop, MD [facility] P11/22/2022, 1:21 PM

## 2021-03-14 DIAGNOSIS — L8990 Pressure ulcer of unspecified site, unspecified stage: Secondary | ICD-10-CM | POA: Insufficient documentation

## 2021-03-14 LAB — CREATININE, SERUM
Creatinine, Ser: 0.7 mg/dL (ref 0.44–1.00)
GFR, Estimated: >60 mL/min (ref >60)

## 2021-03-14 MED ORDER — NITROFURANTOIN MONOHYD MACRO 100 MG PO CAPS: 100.0000 mg | ORAL_CAPSULE | Freq: Two times a day (BID) | ORAL | 0 refills | Status: AC

## 2021-03-14 MED ORDER — TEMAZEPAM 30 MG PO CAPS: 30.0000 mg | ORAL_CAPSULE | Freq: Every evening | ORAL | 0 refills | Status: AC | PRN

## 2021-03-14 MED ORDER — MORPHINE SULFATE ER 30 MG PO TBCR: 30.0000 mg | EXTENDED_RELEASE_TABLET | Freq: Two times a day (BID) | ORAL | 0 refills | Status: AC

## 2021-03-14 NOTE — Progress Notes (Signed)
Occupational Therapy Treatment Patient Details Name: Kiara Jones MRN: [id number] DOB: Sep 08, 1935 Today's Date: 03/14/2021   History of present illness Kiara Jones is a 85 y.o. female with medical history significant of COPD, hyperlipidemia, hypertension, hypothyroidism, lymphedema, RLS, neuropathy, spinal stenosis, CKD who presents to ED 03/07/21 with ongoing shortness of breath and cough. post fall 01/22/21 and sustained  distal left radius and ulna fracture/splinted in ED   OT comments  Pt progressing towards acute OT goals. Able to stand about 45 seconds with mod A and reliance on external support. May benefit from hemiwalker? D/c plan remains appropriate.    Recommendations for follow up therapy are one component of a multi-disciplinary discharge planning process, led by the attending physician.  Recommendations may be updated based on patient status, additional functional criteria and insurance authorization.    Follow Up Recommendations  Skilled nursing-short term rehab (<3 hours/day)    Assistance Recommended at Discharge Frequent or constant Supervision/Assistance  Equipment Recommendations  Other (comment) (defer to next venue)    Recommendations for Other Services      Precautions / Restrictions Precautions Precautions: Fall Precaution Comments: urinary urgency, need briefs Restrictions Weight Bearing Restrictions: Yes LUE Weight Bearing: Non weight bearing Other Position/Activity Restrictions: assume NWB/WB through elbow only, hand wrist- in cast       Mobility Bed Mobility               General bed mobility comments: in recliner    Transfers Overall transfer level: Needs assistance Equipment used: 1 person hand held assist Transfers: Sit to/from Stand Sit to Stand: Mod assist           General transfer comment: steadying assist. BLE weakness noted. Pt utilizes legs against chair and +1 HHA to static stand     Balance Overall balance  assessment: Needs assistance;History of Falls Sitting-balance support: Single extremity supported;Feet supported Sitting balance-Leahy Scale: Fair     Standing balance support: During functional activity;Single extremity supported Standing balance-Leahy Scale: Poor Standing balance comment: required external support                           ADL either performed or assessed with clinical judgement   ADL Overall ADL's : Needs assistance/impaired                                       General ADL Comments: Pt receivied in recliner. Stood 1x and maintainined standing for about 45 seconds with up to mod A, +1HHA. May benefit from hemiwalker?    Extremity/Trunk Assessment Upper Extremity Assessment Upper Extremity Assessment: Generalized weakness;LUE deficits/detail LUE Deficits / Details: Grossly functional ROM of shoulder and elbow. Wrist in hard cast. Patinet unable to flex fingers fully and reports pain with ROM.   Lower Extremity Assessment Lower Extremity Assessment: Defer to PT evaluation        Vision       Perception     Praxis      Cognition Arousal/Alertness: Awake/alert Behavior During Therapy: WFL for tasks assessed/performed Overall Cognitive Status: Within Functional Limits for tasks assessed                                 General Comments: pt reporting she has been bed bound d/t L arm injury  Exercises     Shoulder Instructions       General Comments      Pertinent Vitals/ Pain       Pain Assessment: Faces Faces Pain Scale: Hurts little more Pain Location: left fingers and forearm, back Pain Descriptors / Indicators: Discomfort;Grimacing;Guarding Pain Intervention(s): Monitored during session;Limited activity within patient's tolerance;Repositioned  Home Living                                          Prior Functioning/Environment              Frequency  Min 2X/week         Progress Toward Goals  OT Goals(current goals can now be found in the care plan section)  Progress towards OT goals: Progressing toward goals  Acute Rehab OT Goals Patient Stated Goal: rehab then home OT Goal Formulation: With patient Time For Goal Achievement: 03/22/21 Potential to Achieve Goals: Fair ADL Goals Pt Will Perform Lower Body Dressing: sit to/from stand;with min assist Pt Will Transfer to Toilet: with min guard assist;ambulating;regular height toilet;grab bars Pt Will Perform Toileting - Clothing Manipulation and hygiene: sitting/lateral leans;sit to/from stand;with min guard assist Additional ADL Goal #1: Patient will tolerate full PROM of fingers on left hand. Additional ADL Goal #2: Patient will perform 10 min functional activity or exercise activity as evidence of improving activity tolerance  Plan Discharge plan remains appropriate    Co-evaluation                 AM-PAC OT "6 Clicks" Daily Activity     Outcome Measure   Help from another person eating meals?: A Little Help from another person taking care of personal grooming?: A Little Help from another person toileting, which includes using toliet, bedpan, or urinal?: Total Help from another person bathing (including washing, rinsing, drying)?: A Lot Help from another person to put on and taking off regular upper body clothing?: A Lot Help from another person to put on and taking off regular lower body clothing?: Total 6 Click Score: 12    End of Session    OT Visit Diagnosis: Unsteadiness on feet (R26.81);Muscle weakness (generalized) (M62.81);Pain   Activity Tolerance Patient tolerated treatment well   Patient Left in chair;with call bell/phone within reach;with chair alarm set   Nurse Communication          Time: [phone] OT Time Calculation (min): 15 min  Charges: OT General Charges $OT Visit: 1 Visit OT Treatments $Self Care/Home Management : 8-22 mins  Raynald Kemp, OT Acute Rehabilitation Services Pager: [phone] Office: [phone]   Pilar Grammes 03/14/2021, 11:20 AM

## 2021-03-14 NOTE — Progress Notes (Signed)
MD order to transfer.  DC instructions given to EMS personnel

## 2021-03-14 NOTE — TOC Transition Note (Signed)
Transition of Care [facility]) - CM/SW Discharge Note   Patient Details  Name: Kiara Jones MRN: [id number] Date of Birth: 21-Mar-1936  Transition of Care [facility] CM/SW Contact:  Ida Rogue, LCSW Phone Number: 03/14/2021, 10:26 AM   Clinical Narrative:   Patient who is stable for d/c will transfer to [facility] SNF today.  Family alerted. PTAR arranged.  Nusing, please call report to [phone]. TOC sign off.    Final next level of care: Skilled Nursing Facility Barriers to Discharge: Barriers Resolved   Patient Goals and CMS Choice     Choice offered to / list presented to : Patient, Sibling  Discharge Placement                       Discharge Plan and Services   Discharge Planning Services: CM Consult Post Acute Care Choice: Skilled Nursing Facility                               Social Determinants of Health (SDOH) Interventions     Readmission Risk Interventions No flowsheet data found.

## 2021-03-14 NOTE — Discharge Summary (Signed)
Kiara Jones ZOX:[id number] DOB: 1935-09-06 DOA: 03/07/2021  PCP: Carmel Sacramento, NP  Admit date: 03/07/2021 Discharge date: 03/14/2021  Time spent: 35 minutes  Recommendations for Outpatient Follow-up:  Needs to f/u with hand specialist 11/30    Discharge Diagnoses:  Principal Problem:   COPD exacerbation (HCC) Active Problems:   Hypothyroidism   RLS (restless legs syndrome)   Spinal stenosis of lumbar region   Hyperlipidemia   Hypertension   Acute respiratory failure with hypoxia (HCC)   Pressure injury of skin   Discharge Condition: stable  Diet recommendation: heart healthy  Filed Weights   03/07/21 1348  Weight: 45.8 kg    History of present illness:  Kiara Jones is a 85 y.o. female with medical history significant of COPD, hyperlipidemia, hypertension, hypothyroidism, lymphedema, RLS, neuropathy, spinal stenosis, CKD who presents with ongoing shortness of breath and cough.  Patient states that she has progressive shortness of breath and cough for the past 3 to 4 days.  She states it was initially productive of sputum however since then she has had trouble getting sputum up. Has not been improving so she called called her doctor's office who recommend that she come in for a visit where she come to the ED for further evaluation.  She did not like either option and stated she was going to do neither.  She then gave the phone to her brother-in-law who apparently called the ambulance to transport her to the ED for further evaluation.     She does report fever at home she measured it at 99.8.  She additionally reports some dysuria, increased urinary frequency and some odor to her urine. She also notes that she fell few weeks ago and has some hesitancy to walk at this time.  Family member reported that she will walk with a walker and assistance.  Her doctor outpatient had already referred her to PT but she has not seen them yet. Also reports constipation.    She denies  chills, chest pain, abdominal pain, diarrhea, nausea, vomiting  Hospital Course:  Patient is a 85 year old female with history of COPD, hyperlipidemia, hypertension, hypothyroidism, lymphedema, restless leg syndrome, spinal stenosis who presented from home with complaints of shortness of breath, cough.  She lives with her brother.  She recently had a fracture of her left wrist after she fell at home.  She was complaining of progressive shortness of breath and cough for last 3 to 4 days.  She also reported mild fever at home along with increased urgency, frequency of urine, dysuria.  She was found to be hypoxic on room air and had to be put on 2 L of oxygen.  Lab work showed mild leukocytosis.  Flu, COVID screen test negative.  Chest x-ray did not show pneumonia.  Patient was admitted for the management of COPD exacerbation and possible UTI. Treated with steroids and antibiotics.  Respiratory status has significantly improved. Will finish nitrofurantoin for e faecalis uti. For hand fracture (POA) will need f/u with her hand specialist on 11/30 as scheduled.  Procedures: none   Consultations: none  Discharge Exam: Vitals:   03/13/21 2205 03/14/21 0526  BP: (!) 114/58 122/74  Pulse: 76 74  Resp: 18 18  Temp: 98.6 F (37 C) 98.2 F (36.8 C)  SpO2: 92% 91%      Discharge Instructions   Discharge Instructions     Diet - low sodium heart healthy   Complete by: As directed    Discharge wound care:   Complete  by: As directed    Dress daily, frequent repositioning   Increase activity slowly   Complete by: As directed       Allergies as of 03/14/2021       Reactions   Lyrica [pregabalin] Shortness Of Breath, Other (See Comments)   Blurred vision, fatigue   Alprazolam Other (See Comments)   Sick to stomach, tremors inside & out, loss of appetite   Aspirin Other (See Comments)   Codeine Other (See Comments)   Heartburn, ulcer upset indigestion   Nsaids Other (See Comments)    "upsets my ulcer"   Penicillins Other (See Comments)   Cold sweat, almost passed out   Sertraline Other (See Comments)   Sick to stomach, tremors inside & out, loss of appetite   Tapentadol Other (See Comments)   Per patient reaction unknown   Silicone Rash   Silicone-based pain patches - caused a rash   Sulfa Antibiotics Rash   Tape Rash        Medication List     STOP taking these medications    benzonatate 100 MG capsule Commonly known as: Tessalon Perles   chlorpheniramine-HYDROcodone 10-8 MG/5ML Suer Commonly known as: Tussionex Pennkinetic ER   HYDROcodone bit-homatropine 5-1.5 MG/5ML syrup Commonly known as: HYCODAN   oxyCODONE-acetaminophen 10-325 MG tablet Commonly known as: PERCOCET Replaced by: oxyCODONE-acetaminophen 5-325 MG tablet       TAKE these medications    albuterol 108 (90 Base) MCG/ACT inhaler Commonly known as: VENTOLIN HFA Inhale 1-2 puffs into the lungs every 6 (six) hours as needed for shortness of breath.   amLODipine 2.5 MG tablet Commonly known as: NORVASC Take 1 tablet (2.5 mg total) by mouth 2 (two) times daily.   Breo Ellipta 200-25 MCG/ACT Aepb Generic drug: fluticasone furoate-vilanterol Inhale 1 puff into the lungs daily.   busPIRone 15 MG tablet Commonly known as: BUSPAR TAKE ONE TABLET BY MOUTH TWICE A DAY   Carbidopa-Levodopa ER 25-100 MG tablet controlled release Commonly known as: Sinemet CR Take 2 tablets by mouth daily.   diclofenac Sodium 1 % Gel Commonly known as: Voltaren Apply 4 g topically 4 (four) times daily as needed.   escitalopram 5 MG tablet Commonly known as: Lexapro Take 0.5 tablets (2.5 mg total) by mouth daily.   gabapentin 100 MG capsule Commonly known as: NEURONTIN Take 100 mg by mouth 3 (three) times daily.   hydrochlorothiazide 12.5 MG tablet Commonly known as: HYDRODIURIL Take 1 tablet (12.5 mg total) by mouth daily.   levothyroxine 75 MCG tablet Commonly known as: Synthroid Take  1 tablet (75 mcg total) by mouth daily before breakfast.   losartan 100 MG tablet Commonly known as: COZAAR Take 1 tablet (100 mg total) by mouth daily.   morphine 30 MG 12 hr tablet Commonly known as: MS CONTIN Take 1 tablet (30 mg total) by mouth 2 (two) times daily.   nitrofurantoin (macrocrystal-monohydrate) 100 MG capsule Commonly known as: MACROBID Take 1 capsule (100 mg total) by mouth every 12 (twelve) hours.   oxyCODONE-acetaminophen 5-325 MG tablet Commonly known as: PERCOCET/ROXICET Take 1 tablet by mouth every 8 (eight) hours as needed for severe pain. Replaces: oxyCODONE-acetaminophen 10-325 MG tablet   pravastatin 80 MG tablet Commonly known as: PRAVACHOL Take 1 tablet (80 mg total) by mouth daily.   temazepam 30 MG capsule Commonly known as: RESTORIL Take 1 capsule (30 mg total) by mouth at bedtime as needed for sleep.   Trelegy Ellipta 100-62.5-25 MCG/ACT Aepb Generic drug: Fluticasone-Umeclidin-Vilant  Inhale 1 puff into the lungs daily.               Discharge Care Instructions  (From admission, onward)           Start     Ordered   03/14/21 0000  Discharge wound care:       Comments: Dress daily, frequent repositioning   03/14/21 0908           Allergies  Allergen Reactions   Lyrica [Pregabalin] Shortness Of Breath and Other (See Comments)    Blurred vision, fatigue   Alprazolam Other (See Comments)    Sick to stomach, tremors inside & out, loss of appetite   Aspirin Other (See Comments)   Codeine Other (See Comments)    Heartburn, ulcer upset indigestion   Nsaids Other (See Comments)    "upsets my ulcer"    Penicillins Other (See Comments)    Cold sweat, almost passed out   Sertraline Other (See Comments)    Sick to stomach, tremors inside & out, loss of appetite   Tapentadol Other (See Comments)    Per patient reaction unknown   Silicone Rash    Silicone-based pain patches - caused a rash   Sulfa Antibiotics Rash   Tape  Rash    Contact information for follow-up providers     [facility] Follow up.   Why: [facility] will follow you at home for IV medications. Please call [phone].        your hand specialist Follow up on 03/21/2021.               Contact information for after-discharge care     Destination     [facility] .   Service: Skilled Nursing Contact information: [address] [place] [address] [phone]                      The results of significant diagnostics from this hospitalization (including imaging, microbiology, ancillary and laboratory) are listed below for reference.    Significant Diagnostic Studies: DG Chest Port 1 View  Result Date: 03/07/2021 CLINICAL DATA:  Shortness of breath, cough. EXAM: PORTABLE CHEST 1 VIEW COMPARISON:  January 22, 2021. FINDINGS: The heart size and mediastinal contours are within normal limits. Both lungs are clear. The visualized skeletal structures are unremarkable. IMPRESSION: No active disease. Aortic Atherosclerosis (ICD10-I70.0). Electronically Signed   By: Lupita Raider M.D.   On: 03/07/2021 14:14    Microbiology: Recent Results (from the past 240 hour(s))  Resp Panel by RT-PCR (Flu A&B, Covid) Nasopharyngeal Swab     Status: None   Collection Time: 03/07/21  2:01 PM   Specimen: Nasopharyngeal Swab; Nasopharyngeal(NP) swabs in vial transport medium  Result Value Ref Range Status   SARS Coronavirus 2 by RT PCR NEGATIVE NEGATIVE Final    Comment: (NOTE) SARS-CoV-2 target nucleic acids are NOT DETECTED.  The SARS-CoV-2 RNA is generally detectable in upper respiratory specimens during the acute phase of infection. The lowest concentration of SARS-CoV-2 viral copies this assay can detect is 138 copies/mL. A negative result does not preclude SARS-Cov-2 infection and should not be used as the sole basis for treatment or other patient management decisions.  A negative result may occur with  improper specimen collection/handling, submission of specimen other than nasopharyngeal swab, presence of viral mutation(s) within the areas targeted by this assay, and inadequate number of viral copies(<138 copies/mL). A negative result must  be combined with clinical observations, patient history, and epidemiological information. The expected result is Negative.  Fact Sheet for Patients:  BloggerCourse.com  Fact Sheet for Healthcare Providers:  SeriousBroker.it  This test is no t yet approved or cleared by the [place] FDA and  has been authorized for detection and/or diagnosis of SARS-CoV-2 by FDA under an Emergency Use Authorization (EUA). This EUA will remain  in effect (meaning this test can be used) for the duration of the COVID-19 declaration under Section 564(b)(1) of the Act, 21 U.S.C.section 360bbb-3(b)(1), unless the authorization is terminated  or revoked sooner.       Influenza A by PCR NEGATIVE NEGATIVE Final   Influenza B by PCR NEGATIVE NEGATIVE Final    Comment: (NOTE) The Xpert Xpress SARS-CoV-2/FLU/RSV plus assay is intended as an aid in the diagnosis of influenza from Nasopharyngeal swab specimens and should not be used as a sole basis for treatment. Nasal washings and aspirates are unacceptable for Xpert Xpress SARS-CoV-2/FLU/RSV testing.  Fact Sheet for Patients: BloggerCourse.com  Fact Sheet for Healthcare Providers: SeriousBroker.it  This test is not yet approved or cleared by the [place] FDA and has been authorized for detection and/or diagnosis of SARS-CoV-2 by FDA under an Emergency Use Authorization (EUA). This EUA will remain in effect (meaning this test can be used) for the duration of the COVID-19 declaration under Section 564(b)(1) of the Act, 21 U.S.C. section 360bbb-3(b)(1), unless the authorization  is terminated or revoked.  Performed at [facility], [address], [place], [place] [address]   Respiratory (~20 pathogens) panel by PCR     Status: None   Collection Time: 03/08/21  6:07 AM   Specimen: Nasopharyngeal Swab; Respiratory  Result Value Ref Range Status   Adenovirus NOT DETECTED NOT DETECTED Final   Coronavirus 229E NOT DETECTED NOT DETECTED Final    Comment: (NOTE) The Coronavirus on the Respiratory Panel, DOES NOT test for the novel  Coronavirus (2019 nCoV)    Coronavirus HKU1 NOT DETECTED NOT DETECTED Final   Coronavirus NL63 NOT DETECTED NOT DETECTED Final   Coronavirus OC43 NOT DETECTED NOT DETECTED Final   Metapneumovirus NOT DETECTED NOT DETECTED Final   Rhinovirus / Enterovirus NOT DETECTED NOT DETECTED Final   Influenza A NOT DETECTED NOT DETECTED Final   Influenza B NOT DETECTED NOT DETECTED Final   Parainfluenza Virus 1 NOT DETECTED NOT DETECTED Final   Parainfluenza Virus 2 NOT DETECTED NOT DETECTED Final   Parainfluenza Virus 3 NOT DETECTED NOT DETECTED Final   Parainfluenza Virus 4 NOT DETECTED NOT DETECTED Final   Respiratory Syncytial Virus NOT DETECTED NOT DETECTED Final   Bordetella pertussis NOT DETECTED NOT DETECTED Final   Bordetella Parapertussis NOT DETECTED NOT DETECTED Final   Chlamydophila pneumoniae NOT DETECTED NOT DETECTED Final   Mycoplasma pneumoniae NOT DETECTED NOT DETECTED Final    Comment: Performed at [facility], [address] [place], [place] [address]  Urine Culture     Status: Abnormal   Collection Time: 03/08/21  9:32 AM   Specimen: Urine, Clean Catch  Result Value Ref Range Status   Specimen Description   Final    URINE, CLEAN CATCH Performed at [facility], [address], [place], [place] [address]    Special Requests   Final    NONE Performed at [facility], [address], [place], [place] [address]    Culture >=100,000 COLONIES/mL ENTEROCOCCUS  FAECALIS (A)  Final  Report Status 03/10/2021 FINAL  Final   Organism ID, Bacteria ENTEROCOCCUS FAECALIS (A)  Final      Susceptibility   Enterococcus faecalis - MIC*    AMPICILLIN <=2 SENSITIVE Sensitive     NITROFURANTOIN <=16 SENSITIVE Sensitive     VANCOMYCIN 1 SENSITIVE Sensitive     * >=100,000 COLONIES/mL ENTEROCOCCUS FAECALIS  SARS CORONAVIRUS 2 (TAT 6-24 HRS) Nasopharyngeal Nasopharyngeal Swab     Status: None   Collection Time: 03/12/21  1:04 AM   Specimen: Nasopharyngeal Swab  Result Value Ref Range Status   SARS Coronavirus 2 NEGATIVE NEGATIVE Final    Comment: (NOTE) SARS-CoV-2 target nucleic acids are NOT DETECTED.  The SARS-CoV-2 RNA is generally detectable in upper and lower respiratory specimens during the acute phase of infection. Negative results do not preclude SARS-CoV-2 infection, do not rule out co-infections with other pathogens, and should not be used as the sole basis for treatment or other patient management decisions. Negative results must be combined with clinical observations, patient history, and epidemiological information. The expected result is Negative.  Fact Sheet for Patients: HairSlick.no  Fact Sheet for Healthcare Providers: quierodirigir.com  This test is not yet approved or cleared by the [place] FDA and  has been authorized for detection and/or diagnosis of SARS-CoV-2 by FDA under an Emergency Use Authorization (EUA). This EUA will remain  in effect (meaning this test can be used) for the duration of the COVID-19 declaration under Se ction 564(b)(1) of the Act, 21 U.S.C. section 360bbb-3(b)(1), unless the authorization is terminated or revoked sooner.  Performed at [facility], [address] [place], [place] [address]   Resp Panel by RT-PCR (Flu A&B, Covid) Nasopharyngeal Swab     Status: None   Collection Time: 03/12/21 11:10 AM   Specimen: Nasopharyngeal Swab;  Nasopharyngeal(NP) swabs in vial transport medium  Result Value Ref Range Status   SARS Coronavirus 2 by RT PCR NEGATIVE NEGATIVE Final    Comment: (NOTE) SARS-CoV-2 target nucleic acids are NOT DETECTED.  The SARS-CoV-2 RNA is generally detectable in upper respiratory specimens during the acute phase of infection. The lowest concentration of SARS-CoV-2 viral copies this assay can detect is 138 copies/mL. A negative result does not preclude SARS-Cov-2 infection and should not be used as the sole basis for treatment or other patient management decisions. A negative result may occur with  improper specimen collection/handling, submission of specimen other than nasopharyngeal swab, presence of viral mutation(s) within the areas targeted by this assay, and inadequate number of viral copies(<138 copies/mL). A negative result must be combined with clinical observations, patient history, and epidemiological information. The expected result is Negative.  Fact Sheet for Patients:  BloggerCourse.com  Fact Sheet for Healthcare Providers:  SeriousBroker.it  This test is no t yet approved or cleared by the [place] FDA and  has been authorized for detection and/or diagnosis of SARS-CoV-2 by FDA under an Emergency Use Authorization (EUA). This EUA will remain  in effect (meaning this test can be used) for the duration of the COVID-19 declaration under Section 564(b)(1) of the Act, 21 U.S.C.section 360bbb-3(b)(1), unless the authorization is terminated  or revoked sooner.       Influenza A by PCR NEGATIVE NEGATIVE Final   Influenza B by PCR NEGATIVE NEGATIVE Final    Comment: (NOTE) The Xpert Xpress SARS-CoV-2/FLU/RSV plus assay is intended as an aid in the diagnosis of influenza from Nasopharyngeal swab specimens and should not be used as a sole basis for  treatment. Nasal washings and aspirates are unacceptable for Xpert Xpress  SARS-CoV-2/FLU/RSV testing.  Fact Sheet for Patients: BloggerCourse.com  Fact Sheet for Healthcare Providers: SeriousBroker.it  This test is not yet approved or cleared by the [place] FDA and has been authorized for detection and/or diagnosis of SARS-CoV-2 by FDA under an Emergency Use Authorization (EUA). This EUA will remain in effect (meaning this test can be used) for the duration of the COVID-19 declaration under Section 564(b)(1) of the Act, 21 U.S.C. section 360bbb-3(b)(1), unless the authorization is terminated or revoked.  Performed at [facility], [address] [place], [place] [address]      Labs: Basic Metabolic Panel: Recent Labs  Lab 03/07/21 1421 03/14/21 0453  NA 135  --   K 3.7  --   CL 96*  --   CO2 30  --   GLUCOSE 99  --   BUN 18  --   CREATININE 0.75 0.70  CALCIUM 9.8  --    Liver Function Tests: No results for input(s): AST, ALT, ALKPHOS, BILITOT, PROT, ALBUMIN in the last 168 hours. No results for input(s): LIPASE, AMYLASE in the last 168 hours. No results for input(s): AMMONIA in the last 168 hours. CBC: Recent Labs  Lab 03/07/21 1421 03/08/21 0527  WBC 12.9* 10.2  NEUTROABS 10.5*  --   HGB 13.7 13.3  HCT 42.3 40.6  MCV 92.2 92.1  PLT 230 198   Cardiac Enzymes: No results for input(s): CKTOTAL, CKMB, CKMBINDEX, TROPONINI in the last 168 hours. BNP: BNP (last 3 results) No results for input(s): BNP in the last 8760 hours.  ProBNP (last 3 results) No results for input(s): PROBNP in the last 8760 hours.  CBG: No results for input(s): GLUCAP in the last 168 hours.     Signed:  Silvano Bilis MD.  [facility] 03/14/2021, 9:09 AM

## 2021-04-22 DEATH — deceased

## 2021-07-04 ENCOUNTER — Ambulatory Visit: Payer: Medicare Other | Admitting: Cardiology

## 2021-07-24 ENCOUNTER — Ambulatory Visit: Payer: Medicare Other | Admitting: Cardiology

## 2022-04-06 IMAGING — DX DG CHEST 1V PORT
1 series · 1 of 1 positions shown · non-contrast
Comparison: January 22, 2021.

CLINICAL DATA: Shortness of breath, cough.

EXAM:
PORTABLE CHEST 1 VIEW

[chest ap]
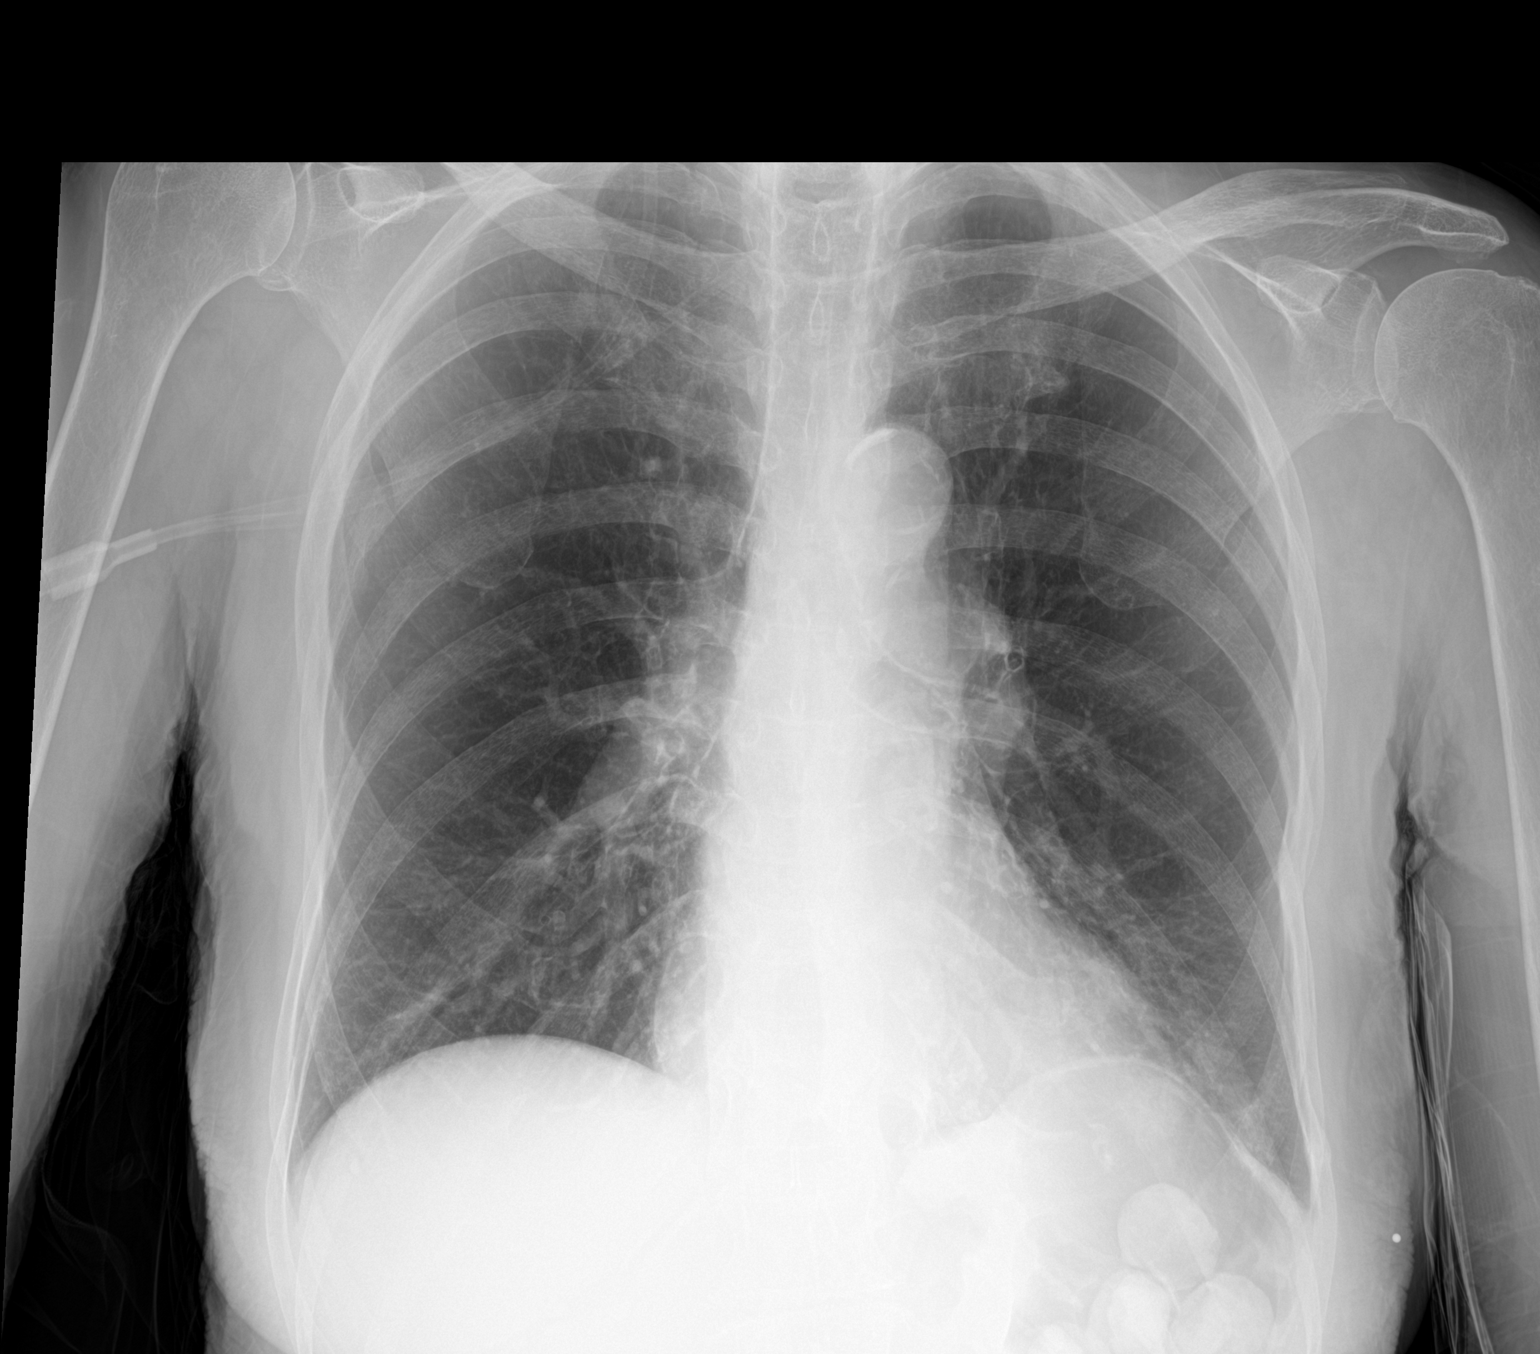

[1 of 1 positions shown; findings below may reference images not displayed]

FINDINGS: The heart size and mediastinal contours are within normal limits.
Both lungs are clear. The visualized skeletal structures are
unremarkable.
IMPRESSION: No active disease.

Aortic Atherosclerosis ([id number].S).
